# Patient Record
Sex: Female | Born: 1970
Health system: Southern US, Community
[De-identification: ages and names within clinical notes are randomized; demographics above are authoritative.]

## PROBLEM LIST (undated history)

## (undated) HISTORY — PX: TUMOR EXCISION: SHX421

---

## 1998-06-15 ENCOUNTER — Encounter: Admission: RE | Admit: 1998-06-15 | Discharge: 1998-06-15 | Payer: Self-pay | Admitting: Family Medicine

## 2000-07-12 ENCOUNTER — Encounter: Admission: RE | Admit: 2000-07-12 | Discharge: 2000-07-12 | Payer: Self-pay | Admitting: Family Medicine

## 2000-07-12 ENCOUNTER — Ambulatory Visit (HOSPITAL_COMMUNITY): Admission: RE | Admit: 2000-07-12 | Discharge: 2000-07-12 | Payer: Self-pay | Admitting: Family Medicine

## 2000-07-17 ENCOUNTER — Encounter: Admission: RE | Admit: 2000-07-17 | Discharge: 2000-07-17 | Payer: Self-pay | Admitting: Family Medicine

## 2001-04-04 ENCOUNTER — Encounter (INDEPENDENT_AMBULATORY_CARE_PROVIDER_SITE_OTHER): Payer: Self-pay | Admitting: *Deleted

## 2001-04-04 ENCOUNTER — Inpatient Hospital Stay (HOSPITAL_COMMUNITY): Admission: RE | Admit: 2001-04-04 | Discharge: 2001-04-05 | Payer: Self-pay | Admitting: Neurosurgery

## 2001-04-12 ENCOUNTER — Other Ambulatory Visit: Admission: RE | Admit: 2001-04-12 | Discharge: 2001-04-12 | Payer: Self-pay | Admitting: Neurosurgery

## 2001-04-13 ENCOUNTER — Encounter: Payer: Self-pay | Admitting: Neurosurgery

## 2001-04-13 ENCOUNTER — Encounter: Admission: RE | Admit: 2001-04-13 | Discharge: 2001-04-13 | Payer: Self-pay | Admitting: Neurosurgery

## 2001-04-15 ENCOUNTER — Emergency Department (HOSPITAL_COMMUNITY): Admission: EM | Admit: 2001-04-15 | Discharge: 2001-04-15 | Payer: Self-pay

## 2001-04-18 ENCOUNTER — Encounter: Payer: Self-pay | Admitting: Hematology and Oncology

## 2001-04-18 ENCOUNTER — Inpatient Hospital Stay (HOSPITAL_COMMUNITY): Admission: EM | Admit: 2001-04-18 | Discharge: 2001-04-27 | Payer: Self-pay | Admitting: Hematology and Oncology

## 2001-04-18 ENCOUNTER — Encounter (INDEPENDENT_AMBULATORY_CARE_PROVIDER_SITE_OTHER): Payer: Self-pay | Admitting: Specialist

## 2001-06-05 ENCOUNTER — Ambulatory Visit (HOSPITAL_COMMUNITY): Admission: RE | Admit: 2001-06-05 | Discharge: 2001-06-05 | Payer: Self-pay | Admitting: Hematology and Oncology

## 2001-06-05 ENCOUNTER — Encounter: Payer: Self-pay | Admitting: Hematology and Oncology

## 2001-07-16 ENCOUNTER — Ambulatory Visit (HOSPITAL_COMMUNITY): Admission: RE | Admit: 2001-07-16 | Discharge: 2001-07-16 | Payer: Self-pay | Admitting: Hematology and Oncology

## 2001-07-16 ENCOUNTER — Encounter: Payer: Self-pay | Admitting: Hematology and Oncology

## 2001-07-16 ENCOUNTER — Encounter (INDEPENDENT_AMBULATORY_CARE_PROVIDER_SITE_OTHER): Payer: Self-pay | Admitting: Specialist

## 2001-07-30 ENCOUNTER — Ambulatory Visit (HOSPITAL_COMMUNITY): Admission: RE | Admit: 2001-07-30 | Discharge: 2001-07-30 | Payer: Self-pay | Admitting: Hematology and Oncology

## 2001-07-30 ENCOUNTER — Encounter: Payer: Self-pay | Admitting: Hematology and Oncology

## 2001-07-31 ENCOUNTER — Encounter: Payer: Self-pay | Admitting: Hematology and Oncology

## 2001-07-31 ENCOUNTER — Ambulatory Visit (HOSPITAL_COMMUNITY): Admission: RE | Admit: 2001-07-31 | Discharge: 2001-07-31 | Payer: Self-pay | Admitting: Hematology and Oncology

## 2001-09-28 ENCOUNTER — Ambulatory Visit: Admission: RE | Admit: 2001-09-28 | Discharge: 2001-10-04 | Payer: Self-pay | Admitting: Radiation Oncology

## 2001-10-05 ENCOUNTER — Ambulatory Visit: Admission: RE | Admit: 2001-10-05 | Discharge: 2001-10-15 | Payer: Self-pay | Admitting: Radiation Oncology

## 2001-11-07 ENCOUNTER — Encounter: Payer: Self-pay | Admitting: *Deleted

## 2001-11-07 ENCOUNTER — Ambulatory Visit (HOSPITAL_COMMUNITY): Admission: RE | Admit: 2001-11-07 | Discharge: 2001-11-07 | Payer: Self-pay | Admitting: *Deleted

## 2002-03-19 ENCOUNTER — Encounter: Payer: Self-pay | Admitting: *Deleted

## 2002-03-19 ENCOUNTER — Ambulatory Visit (HOSPITAL_COMMUNITY): Admission: RE | Admit: 2002-03-19 | Discharge: 2002-03-19 | Payer: Self-pay | Admitting: *Deleted

## 2002-06-27 ENCOUNTER — Encounter: Payer: Self-pay | Admitting: *Deleted

## 2002-06-27 ENCOUNTER — Ambulatory Visit (HOSPITAL_COMMUNITY): Admission: RE | Admit: 2002-06-27 | Discharge: 2002-06-27 | Payer: Self-pay | Admitting: *Deleted

## 2003-02-06 ENCOUNTER — Ambulatory Visit (HOSPITAL_COMMUNITY): Admission: RE | Admit: 2003-02-06 | Discharge: 2003-02-06 | Payer: Self-pay | Admitting: Oncology

## 2003-08-14 ENCOUNTER — Ambulatory Visit (HOSPITAL_COMMUNITY): Admission: RE | Admit: 2003-08-14 | Discharge: 2003-08-14 | Payer: Self-pay | Admitting: Oncology

## 2003-11-11 ENCOUNTER — Ambulatory Visit: Payer: Self-pay | Admitting: Oncology

## 2004-01-01 ENCOUNTER — Ambulatory Visit (HOSPITAL_COMMUNITY): Admission: RE | Admit: 2004-01-01 | Discharge: 2004-01-01 | Payer: Self-pay | Admitting: Oncology

## 2004-03-04 IMAGING — CT CT ABDOMEN W/ CM
1 of 5 series · 12 of 32 positions shown, 18 images · IV contrast (omnipaque)
Comparison: none

FINDINGS
CLINICAL DATA: RESTAGING NON-HODGKIN'S LYMPHOMA.
CT SCAN OF THE CHEST, ABDOMEN, AND PELVIS WITH CONTRAST
COMPARISON IS MADE WITH THE PRIOR EXAMINATIONS DATED 03/19/02.
CT SCAN OF THE CHEST WITH CONTRAST:
MULTIPLE SPIRAL IMAGES WERE MADE THROUGH THE CHEST AFTER INTRAVENOUS INJECTION OF 150 CC OF
OMNIPAQUE 300.
THE THYROID GLAND IS NORMAL.  THERE IS NO SUPRACLAVICULAR ADENOPATHY.  THERE IS NO AXILLARY, HILAR,
OR MEDIASTINAL ADENOPATHY.  THE LUNG FIELDS ARE CLEAR WITH NO PLEURAL EFFUSION.  THERE IS NO BONE
ABNORMALITY.
IMPRESSION
NEGATIVE CT SCAN OF THE CHEST WITH CONTRAST.
CT SCAN OF THE ABDOMEN WITH CONTRAST:
ADDITIONAL IMAGES THROUGH THE ABDOMEN AFTER ORAL AND INTRAVENOUS CONTRAST DEMONSTRATE A NORMAL
APPEARING LIVER AND SPLEEN.  AGAIN NOTED IS FOCAL FATTY CHANGE OF THE MEDIAL SEGMENT OF THE LEFT
LOBE OF THE LIVER.  THERE IS NO ADENOPATHY.  THE PANCREAS AND RETROPERITONEAL STRUCTURES ARE NORMAL.
NEGATIVE CT SCAN OF THE ABDOMEN WITH CONTRAST WITH NO ADENOPATHY.
CT SCAN OF THE PELVIS WITH CONTRAST:
ADDITIONAL IMAGES THROUGH THE PELVIS AFTER ORAL AND INTRAVENOUS CONTRAST DEMONSTRATE NO MASS OR
ADENOPATHY.  THERE IS A VERY SMALL AMOUNT OF FREE FLUID IN THE PELVIS.  THERE ARE BILATERAL
FOLLICULAR CYSTS IN THE OVARIES.
NEGATIVE CT SCAN OF THE PELVIS WITH CONTRAST WITH THE EXCEPTION OF A SMALL AMOUNT OF FREE FLUID IN
THE PELVIS, NORMAL FOR THE PATIENT'S AGE.

[Series 4: a/p 5.0 b30f · axial · 0.63mm/px · z∈[-608,-203]mm · 12 of 97 slices shown, 18 images]
[im 8/97  soft-tissue]
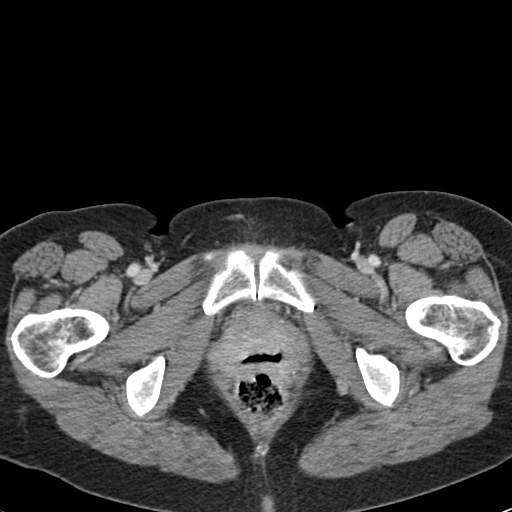
[im 8/97  bone]
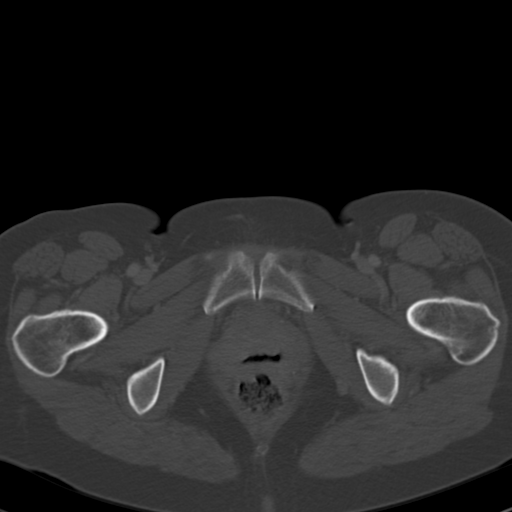
[im 15/97  soft-tissue]
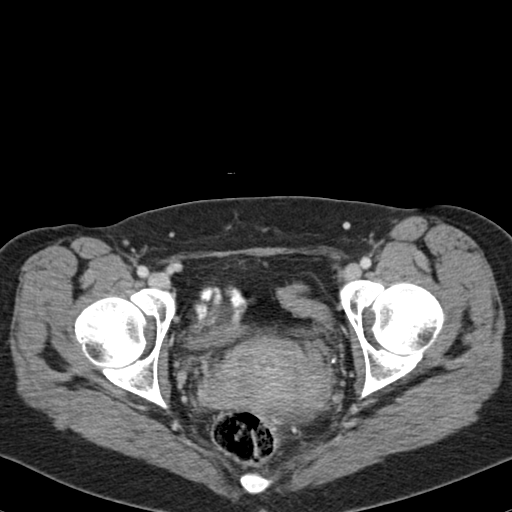
[im 23/97  soft-tissue]
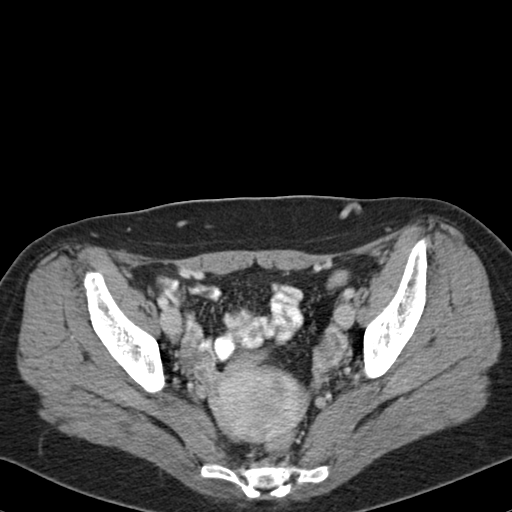
[im 30/97  soft-tissue]
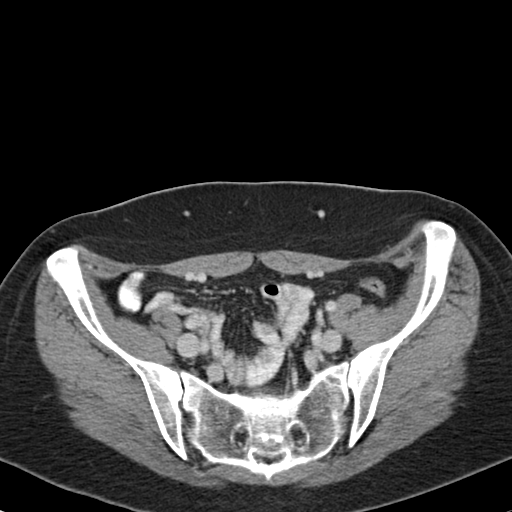
[im 37/97  soft-tissue]
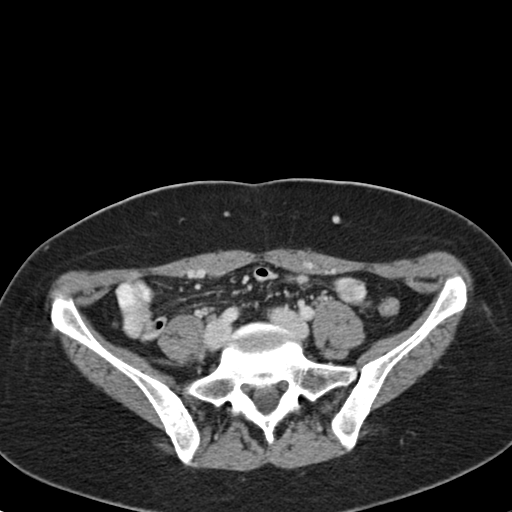
[im 45/97  soft-tissue]
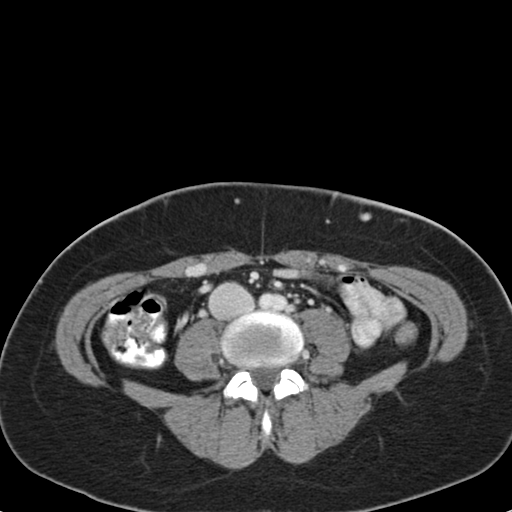
[im 52/97  soft-tissue]
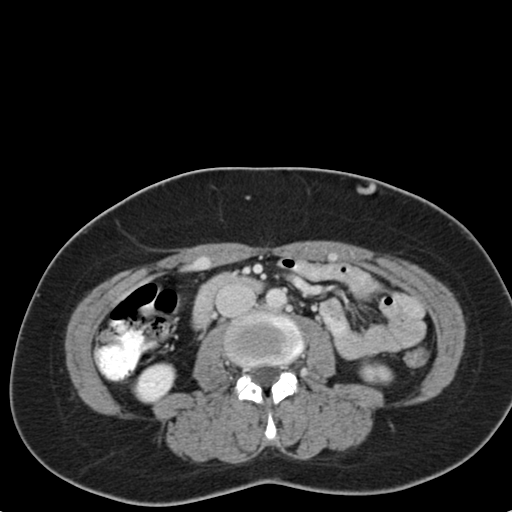
[im 60/97  soft-tissue]
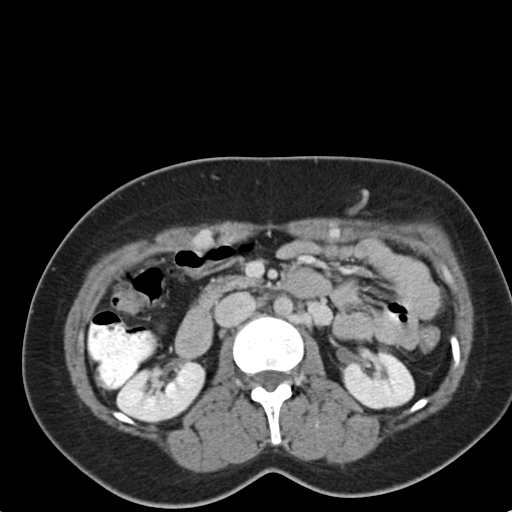
[im 67/97  soft-tissue]
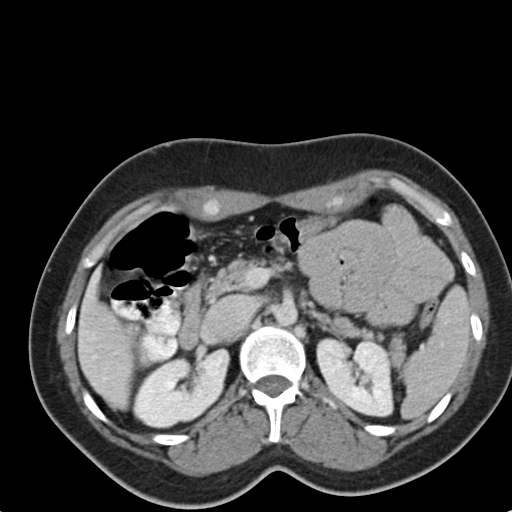
[im 67/97  lung]
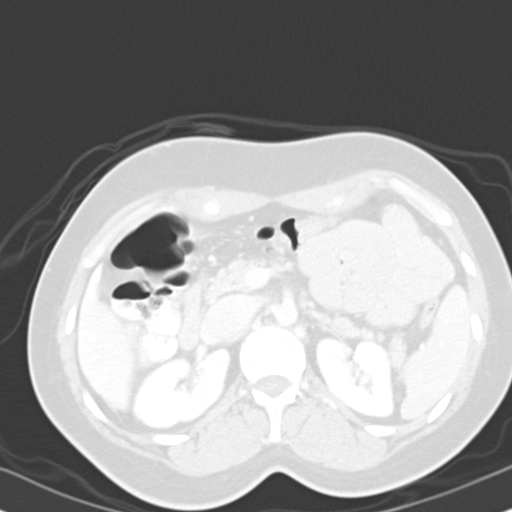
[im 67/97  bone]
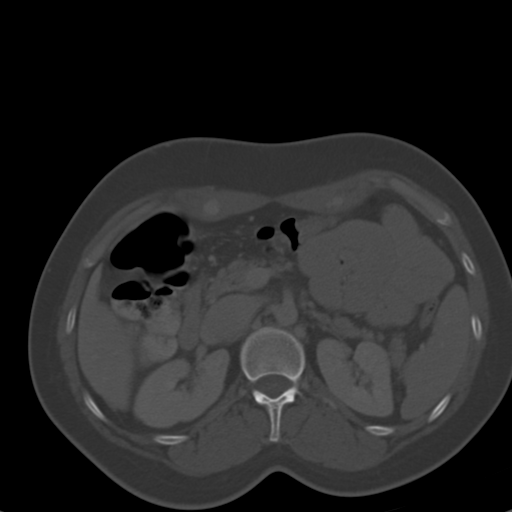
[im 74/97  soft-tissue]
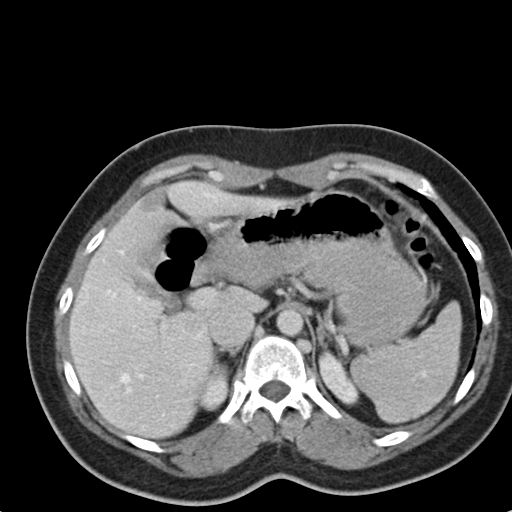
[im 74/97  lung]
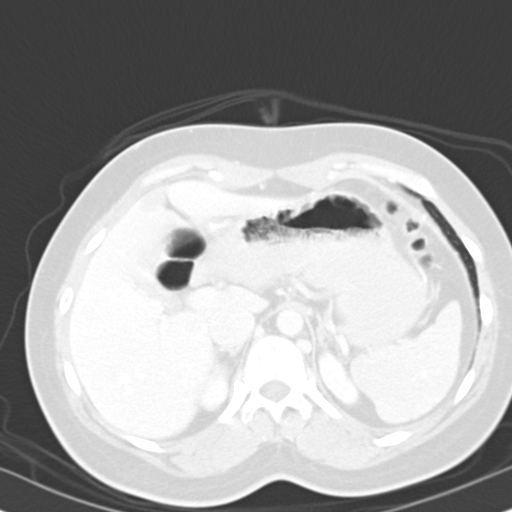
[im 82/97  soft-tissue]
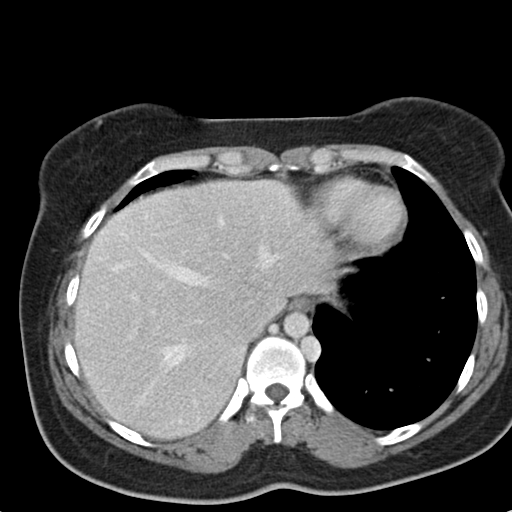
[im 82/97  lung]
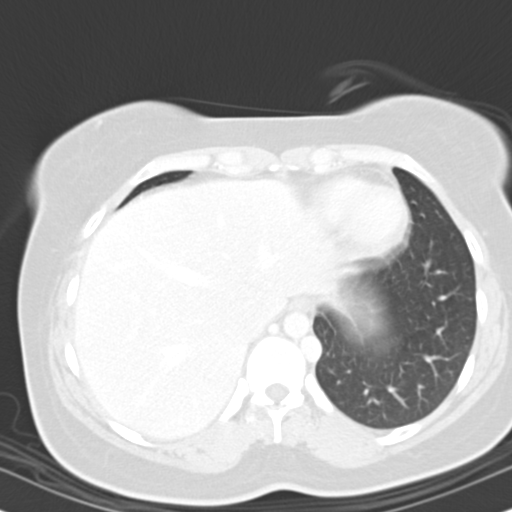
[im 89/97  soft-tissue]
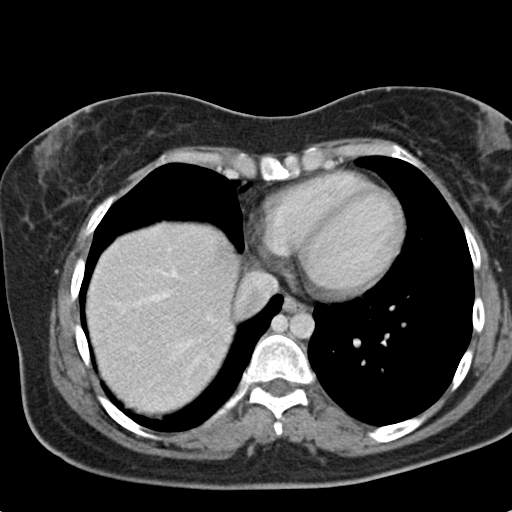
[im 89/97  lung]
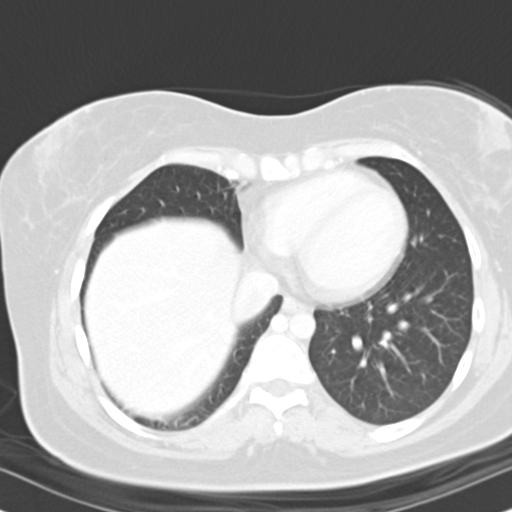

[12 of 32 positions shown; findings below may reference images not displayed]

## 2004-08-02 ENCOUNTER — Ambulatory Visit: Payer: Self-pay | Admitting: Oncology

## 2004-08-10 ENCOUNTER — Ambulatory Visit (HOSPITAL_COMMUNITY): Admission: RE | Admit: 2004-08-10 | Discharge: 2004-08-10 | Payer: Self-pay | Admitting: Oncology

## 2004-09-09 ENCOUNTER — Encounter (INDEPENDENT_AMBULATORY_CARE_PROVIDER_SITE_OTHER): Payer: Self-pay | Admitting: *Deleted

## 2004-09-09 ENCOUNTER — Ambulatory Visit: Admission: RE | Admit: 2004-09-09 | Discharge: 2004-09-09 | Payer: Self-pay | Admitting: Oncology

## 2004-12-13 ENCOUNTER — Ambulatory Visit: Payer: Self-pay | Admitting: Oncology

## 2005-06-20 ENCOUNTER — Ambulatory Visit: Payer: Self-pay | Admitting: Oncology

## 2005-08-09 ENCOUNTER — Ambulatory Visit: Payer: Self-pay | Admitting: Oncology

## 2005-08-10 LAB — CBC WITH DIFFERENTIAL/PLATELET
Basophils Absolute: 0 10*3/uL (ref 0.0–0.1)
Eosinophils Absolute: 0.1 10*3/uL (ref 0.0–0.5)
HCT: 42.9 % (ref 34.8–46.6)
HGB: 14.7 g/dL (ref 11.6–15.9)
LYMPH%: 18.1 % (ref 14.0–48.0)
MCH: 32.6 pg (ref 26.0–34.0)
MCV: 95 fL (ref 81.0–101.0)
MONO%: 8.6 % (ref 0.0–13.0)
NEUT#: 5.3 10*3/uL (ref 1.5–6.5)
NEUT%: 71 % (ref 39.6–76.8)
Platelets: 245 10*3/uL (ref 145–400)

## 2005-08-10 LAB — COMPREHENSIVE METABOLIC PANEL
Albumin: 4.2 g/dL (ref 3.5–5.2)
Alkaline Phosphatase: 52 U/L (ref 39–117)
BUN: 10 mg/dL (ref 6–23)
Creatinine, Ser: 0.76 mg/dL (ref 0.40–1.20)
Glucose, Bld: 103 mg/dL — ABNORMAL HIGH (ref 70–99)
Potassium: 4.9 mEq/L (ref 3.5–5.3)

## 2005-08-23 ENCOUNTER — Ambulatory Visit (HOSPITAL_COMMUNITY): Admission: RE | Admit: 2005-08-23 | Discharge: 2005-08-23 | Payer: Self-pay | Admitting: Oncology

## 2006-08-09 ENCOUNTER — Ambulatory Visit: Payer: Self-pay | Admitting: Oncology

## 2006-08-11 LAB — CBC WITH DIFFERENTIAL/PLATELET
Basophils Absolute: 0 10*3/uL (ref 0.0–0.1)
Eosinophils Absolute: 0.2 10*3/uL (ref 0.0–0.5)
HCT: 41.9 % (ref 34.8–46.6)
HGB: 14.7 g/dL (ref 11.6–15.9)
MCV: 93.7 fL (ref 81.0–101.0)
MONO%: 9.1 % (ref 0.0–13.0)
NEUT#: 5.1 10*3/uL (ref 1.5–6.5)
RDW: 13.2 % (ref 11.3–14.5)
lymph#: 1.2 10*3/uL (ref 0.9–3.3)

## 2006-08-11 LAB — COMPREHENSIVE METABOLIC PANEL
Albumin: 4.2 g/dL (ref 3.5–5.2)
BUN: 8 mg/dL (ref 6–23)
Calcium: 8.9 mg/dL (ref 8.4–10.5)
Chloride: 108 mEq/L (ref 96–112)
Glucose, Bld: 108 mg/dL — ABNORMAL HIGH (ref 70–99)
Potassium: 4.2 mEq/L (ref 3.5–5.3)

## 2007-08-13 ENCOUNTER — Ambulatory Visit: Payer: Self-pay | Admitting: Oncology

## 2009-08-02 ENCOUNTER — Encounter: Admission: RE | Admit: 2009-08-02 | Discharge: 2009-08-02 | Payer: Self-pay | Admitting: Neurosurgery

## 2009-10-06 ENCOUNTER — Encounter: Admission: RE | Admit: 2009-10-06 | Discharge: 2009-10-06 | Payer: Self-pay | Admitting: Neurosurgery

## 2010-01-30 ENCOUNTER — Encounter: Payer: Self-pay | Admitting: Oncology

## 2010-05-19 ENCOUNTER — Other Ambulatory Visit: Payer: Self-pay | Admitting: Obstetrics and Gynecology

## 2010-05-28 NOTE — Discharge Summary (Signed)
[facility]  Patient:    MYLEI, BRACKEEN Visit Number: [id number] MRN: [id number]          Service Type: MED Location: 2S [phone] Attending Physician:  Janan Halter Dictated by:   Lowell C. Catha Gosselin, M.D. Admit Date:  04/18/2001 Discharge Date: 04/27/2001                             Discharge Summary  DISCHARGE DIAGNOSES: 1. Large cell non-Hodgkins lymphoma with need for chemotherapy. 2. Pain control improved secondary to treatment of #1. 3. Cardiac involvement with non-Hodgkins lymphoma with need for    anticoagulation, now accomplished.  SUMMARY:  The patient is a 40 year old female who had developed some facial swelling and subsequently was found to have a radiculopathy.  She went on to the operating room by Dr. Julio Sicks and had right supraclavicular brachioplexus exploration with resection of nerve sheath tumor.  This was sent to [facility] in [place] and was thought to represent a large B-cell type non-Hodgkins lymphoma, ______ CD20 positive.  CT scan of the head had been negative.  CT scan of the neck otherwise was unremarkable.  CT scan of the chest showed some SVC obstruction and question of some right atrial abnormality.  The abdomen was unremarkable.  We subsequently admitted her at this time for further workup.  She did not have any external adenopathy that really could be felt.  HOSPITAL COURSE:  The patient subsequently underwent a MUGA exam, which showed a good ejection fraction, adequate for proceeding with Adriamycin chemotherapy.  This was 57%.  She had an echocardiogram performed on April 18, 2001 which showed left ventricular ejection fraction 55%-65%.  There was a suggestion of a small right atrial mass in the lateral wall.  She subsequently underwent a transesophageal echocardiogram, which did show a mass present, which looked to be an extension of adenopathy through the right atrial wall.  Although no  specific clot was seen, it was felt that she was at high risk for thrombosis and embolism and subsequently was started on first heparin and then coumadinization.  Bone marrow biopsy and aspiration were performed while she was in the hospital.  That returned showing no evidence of any bone marrow involvement and her cytogenetics returned normal.  Pathology is [id number].  Subsequently, we proceeded with CHOP chemotherapy consisting of Cytoxan, Adriamycin, vincristine, and prednisone.  That was well tolerated. The therapy was given on April 21, 2001.  She had no nausea with this.  Her pain control gradually got better after treatment.  Her strength got better as well and she was eating well.  She was given Coumadin teaching.  The family does wish a second opinion over at [facility], which we will arrange as an outpatient.  DISCHARGE MEDICATIONS: 1. OxyContin 40 mg and 20 mg tablets, one of each every 12 hours for pain. 2. Coumadin 5 mg p.o. at 6 p.m. 3. Dilaudid 8 mg p.o. q.4h. p.r.n. for pain. 4. Restoril 30 mg p.o. q.h.s. 5. Phenergan 25 mg p.o. q.6h. p.r.n. for nausea.  ACTIVITY:  No restrictions.  DIET:  No restrictions.  DISCHARGE INSTRUCTIONS:  Call [phone] for problems or questions.  FOLLOWUP:  Return to see me the following week.  CONDITION ON DISCHARGE:  At the time of discharge, her overall status was improved.  PROGNOSIS:  Guarded.  I should mention that her last PT value prior to discharge was 17.8 with  an INR of 1.7. Dictated by:   Lowell C. Catha Gosselin, M.D. Attending Physician:  Janan Halter DD:  05/03/01 TD:  05/04/01 Job: [id number]

## 2010-05-28 NOTE — Op Note (Signed)
[facility]  Patient:    Maureen Richardson, Maureen Richardson Visit Number: [id number] MRN: [id number]          Service Type: SUR Location: [phone] Attending Physician:  Donn Pierini Dictated by:   Julio Sicks, M.D. Proc. 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  OPERATION PERFORMED:  Right supraclavicular brachial plexus exploration with resection of nerve sheath tumor utilizing microdissection.  SURGEON:  Julio Sicks, M.D.  ASSISTANT:  Donalee Citrin, Montez Hageman.  ANESTHESIA:  General endotracheal.  INDICATIONS FOR PROCEDURE:  Ms. Boullion is a 40 year old female with a history of severe right upper extremity pain, paresthesias and weakness consistent with a C8 radiculopathy with some features of a C7 radiculopathy as well. work-up was performed and has demonstrated an enhancing lesion consistent with a neurofibroma at the C3-4 level extending into the right-sided C4 foramen. Also discovered, an extraforaminal nerve sheath tumor involving the C8 nerve extending to the lower trunk of the brachial plexus and with an inferior aspect to this tracking down and causing compression of the stellate ganglion. The patient also had evidence of a Horners syndrome.  I had a long talk with the patient with regard to possible options for treatment.  I explained to her that I did not think that this tumor would be completely resectable no matter what approach was used.  I think that a good portion of her symptoms are due to thoracic outlet syndrome secondary to compression of this tumor against the nerve root by the anterior scalene.  I recommended exploration of her brachial plexus with attempts at resection of this tumor and decompression of her nerve roots by  sectioning the anterior scalene.  The patient is aware of the risks and benefits of the and wishes to proceed.  DESCRIPTION OF PROCEDURE:  The patient was taken to the operating room and placed on the operating table in supine position.  After an adequate level of anesthesia was achieved, the patient was positioned supine with her head turned slightly toward the left.  The patients anterior cervical region was shaved and prepped sterilely.  A linear incision was made along the midpoint of the sternocleidomastoid muscle and carried down over the clavicle.  This was carried down sharply to the platysma.  The platysma was then divided vertically overlying the sternocleidomastoid muscle.  The sternocleidomastoid muscle was dissected free and the clavicular head of the sternocleidomastoid muscle was divided.  The omohyoid muscle was identified and swept superiorly. Adventitial planes were divided.  The fat pad in the posterior triangle was mobilized and retracted laterally.  The anterior scalene muscle was identified.  The phrenic nerve was identified and protected.  The upper, middle and lower trunks of the brachial plexus were identified.  Dissection was then performed proximally along the lower trunk.  C8 nerve root was identified.  The anterior scalene muscle was then divided from a lateral to medial direction freeing the proximal aspect of the lower trunk.  The tumor was encountered along the lateral aspect of the vertebral body and compression the inferior aspects of the C8 nerve.  This tumor was resected.  The C8 nerve was then skeletonized using microdissection.  The C8 nerve was grossly abnormal with a fascicular neurofibroma.  This was dissected free using microdissection and resected.  There is some question as the neurofibroma appeared to track more proximally in the foramen.  It was not feasible to resect any more of this, however.  At this point, all visible tumor had  been resected.  Frozen section was consistent with an atypical neurofibroma but no definite signs of malignancy.  The wound was then irrigated with antibiotic solution.  Surgicel was laid down in the operative field for hemostasis which was found to be good.  The wound was then closed in layers with Vicryl sutures and PDS was used in inverted subcuticular fashion to the skin.  Steri-Strips and sterile dressing were applied.  There were no apparent complications.  The patient tolerated the procedure well and returned to the recovery room postoperatively. Dictated by:   Julio Sicks, M.D. Attending Physician:  Donn Pierini DD:  04/04/01 TD:  04/05/01 Job: [id number]

## 2010-05-28 NOTE — H&P (Signed)
[facility]  Patient:    Maureen Richardson, Maureen Richardson Visit Number: [id number] MRN: [id number]          Service Type: MED Location: 2S [phone] Attending Physician:  Janan Halter Dictated by:   Lowell C. Catha Gosselin, M.D. Admit Date:  04/18/2001   CC:         Julio Sicks, M.D.   History and Physical  DATE OF BIRTH:  12/30/1970  HISTORY OF PRESENT ILLNESS:  [name] is admitted today after I met her in the clinic for the first time.  To review, she is a recently turned 40 year old female who had developed some facial swelling slowly over a years time and severe right upper extremity pain, paresthesias, and weakness consistent with a C8 radiculopathy with some C7 radiculopathy as well.  She ended up having an MRI scan done of the C spine that showed neurofibroma.  It appeared at C3-4, extending into the right-sided C4 foramen and then an extraforaminal nerve sheath tumor involving the C8 nerve.  This extended down to the lower trunk of the brachial plexus and causing compression of the stellate ganglion with evidence of an Horners syndrome.  The patient subsequently went to the operating room on April 04, 2001, by Julio Sicks, M.D., and had a right supraclavicular brachial plexus exploration with resection of the nerve sheath tumor.  Pathology was complex and was sent to Dr. Primitivo Gauze at [facility].  He felt this represented a large B cell type non-Hodgkins lymphoma, highly CD20 positive.  She has had a CT scan done of the head that was negative, a CT scan done of the neck which was unremarkable, and a CT scan done of the chest which showed some SVC obstruction possibly with some adenopathy and the question of possibly even some right atrial abnormality with echocardiogram suggested. There was a cystic-looking structure in the liver, but no burden of abdominal adenopathy.  She is having a fair amount of pain in general.   She is also having some night sweats over the last few weeks.  I am going to admit her at this time because her symptomatology is so severe to go ahead and get an echocardiogram done, a MUGA exam done, a bone marrow exam done, and then start therapy, potentially Rituxan and CHOP chemotherapy.  PAST MEDICAL HISTORY:  Otherwise significant for bilateral tubal ligation. She has had three children.  She does not wish to have any further children.  ALLERGIES:  None.  MEDICATIONS:  She is currently just on p.r.n. pain medication.  FAMILY HISTORY:  Strongly positive for breast and lung cancer.  SOCIAL HISTORY:  She is a smoker.  She lives in [place], [place].  REVIEW OF SYSTEMS:  Otherwise as above.  PHYSICAL EXAMINATION:  She is 5 feet 6 inches tall and weighs 161 pounds.  HEENT:  Oral mucosa within normal limits.  NECK:  No cervical, supraclavicular, or axillary adenopathy.  HEART:  S1 greater than S2 without rubs or gallops.  LUNGS:  Clear to auscultation.  ABDOMEN:  Benign without hepatosplenomegaly.  Bowel sounds active.  No inguinal adenopathy.  No extremity edema noted.  LABORATORY DATA:  Pending.  ASSESSMENT:  Large cell non-Hodgkins lymphoma involving C8.  She probably also has involvement of C3-4.  This looks to be outside the central nervous system.  PLAN:  Since this is outside the central nervous system, we will plan on finishing up staging with a bone marrow  study, get a MUGA exam for pre-Adriamycin, ejection fraction, and also do an echocardiogram to exam the right atrium.  We will potentially start Rituxan and CHOP chemotherapy and possibly given her very unusual presentation have her seen at the medical centers between cycles #1 and #2.  She certainly would be a bone marrow transplant candidate if she does not get a fairly quite remission with standard therapy. Dictated by:   Lowell C. Catha Gosselin, M.D. Attending Physician:  Janan Halter DD:   04/18/01 TD:  04/18/01 Job: [id number]

## 2010-05-28 NOTE — Cardiovascular Report (Signed)
[place]  Patient:    Maureen Richardson, HOLSTER Visit Number: [id number] MRN: [id number]          Service Type: MED Location: 2S [phone] Attending Physician:  Janan Halter Dictated by:   Noralyn Pick Eden Emms, M.D. [facility] Proc. Date: 04/23/01 Admit Date:  04/18/2001 Discharge Date: 04/23/2001   CC:         Lowell C. Catha Gosselin, M.D.   Cardiac Catheterization  PROCEDURES: Transesophageal echocardiography.  INDICATIONS: The patient has lymphoma/leukemia. There is a question of heart involvement. The patient has SVC syndrome. Study was done to assess right atrial mass.  SEDATION: The patient was sedated with 6 mg of Versed.  DESCRIPTION OF PROCEDURE: Using digital technique an Omniplane probe was advanced into the distal esophagus without incident.  Transgastric imaging revealed normal LV function with an EF of 60%. Aortic, mitral, tricuspid, and pulmonary valves were normal. There was a large probable tumor mass in the right atrium on long axis imaging. This clearly aminated from the SVC. There was no flow through the SVC and the mass aminated into the entire superior and posterior dome of the right atrium. There was no atrial septal defect and not extension into the tricuspid regurgitation valve. The mass was somewhat heterogenesis in nature with areas of hypolucency. I suspect that this mass is somewhat thrombogenic and the patient should be anticoagulated.  FINAL IMPRESSION: 1. Large mass in the lateral and posterior wall of the right atrium aminating    from the superior vena cava. 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 2. Normal tricuspid, pulmonary, aortic, and mitral valves. 3. Normal right ventricle and left ventricular function. 4. No pericardial effusion. 5. Normal aorta.  The patient has already begun chemotherapy per hematology oncology. Dictated by:   Noralyn Pick Eden Emms, M.D. [facility] Attending  Physician:  Janan Halter DD:  04/23/01 TD:  04/23/01 Job: [id number]

## 2012-03-14 ENCOUNTER — Other Ambulatory Visit: Payer: Self-pay

## 2012-03-14 DIAGNOSIS — Z1231 Encounter for screening mammogram for malignant neoplasm of breast: Secondary | ICD-10-CM

## 2012-04-18 ENCOUNTER — Ambulatory Visit
Admission: RE | Admit: 2012-04-18 | Discharge: 2012-04-18 | Disposition: A | Discharge status: Home / Self Care | Payer: BC Managed Care – PPO | Source: Ambulatory Visit

## 2012-04-18 DIAGNOSIS — Z1231 Encounter for screening mammogram for malignant neoplasm of breast: Secondary | ICD-10-CM

## 2016-04-29 ENCOUNTER — Other Ambulatory Visit: Payer: Self-pay | Admitting: Specialist

## 2016-04-29 DIAGNOSIS — M5127 Other intervertebral disc displacement, lumbosacral region: Secondary | ICD-10-CM

## 2016-04-29 DIAGNOSIS — M50123 Cervical disc disorder at C6-C7 level with radiculopathy: Secondary | ICD-10-CM

## 2016-05-16 ENCOUNTER — Ambulatory Visit
Admission: RE | Admit: 2016-05-16 | Discharge: 2016-05-16 | Disposition: A | Discharge status: Home / Self Care | Payer: BLUE CROSS/BLUE SHIELD | Source: Ambulatory Visit | Attending: Specialist | Admitting: Specialist

## 2016-05-16 DIAGNOSIS — M50123 Cervical disc disorder at C6-C7 level with radiculopathy: Secondary | ICD-10-CM

## 2016-05-16 DIAGNOSIS — M5127 Other intervertebral disc displacement, lumbosacral region: Secondary | ICD-10-CM

## 2016-05-23 ENCOUNTER — Encounter: Payer: Self-pay | Admitting: Hematology

## 2016-05-30 ENCOUNTER — Ambulatory Visit (HOSPITAL_BASED_OUTPATIENT_CLINIC_OR_DEPARTMENT_OTHER): Payer: BLUE CROSS/BLUE SHIELD

## 2016-05-30 ENCOUNTER — Telehealth: Payer: Self-pay | Admitting: Hematology

## 2016-05-30 ENCOUNTER — Ambulatory Visit (HOSPITAL_BASED_OUTPATIENT_CLINIC_OR_DEPARTMENT_OTHER): Payer: BLUE CROSS/BLUE SHIELD | Admitting: Hematology

## 2016-05-30 VITALS — BP 108/50 | HR 84 | Temp 98.8°F | Resp 20 | Ht 66.5 in | Wt 177.8 lb

## 2016-05-30 DIAGNOSIS — E041 Nontoxic single thyroid nodule: Secondary | ICD-10-CM | POA: Diagnosis not present

## 2016-05-30 DIAGNOSIS — R61 Generalized hyperhidrosis: Secondary | ICD-10-CM

## 2016-05-30 DIAGNOSIS — C8331 Diffuse large B-cell lymphoma, lymph nodes of head, face, and neck: Secondary | ICD-10-CM

## 2016-05-30 LAB — CBC & DIFF AND RETIC
BASO%: 0.2 % (ref 0.0–2.0)
BASOS ABS: 0 10*3/uL (ref 0.0–0.1)
EOS%: 1.8 % (ref 0.0–7.0)
Eosinophils Absolute: 0.2 10*3/uL (ref 0.0–0.5)
HCT: 41.5 % (ref 34.8–46.6)
HGB: 14.1 g/dL (ref 11.6–15.9)
Immature Retic Fract: 2.7 % (ref 1.60–10.00)
LYMPH%: 21.5 % (ref 14.0–49.7)
MCH: 32 pg (ref 25.1–34.0)
MCHC: 34 g/dL (ref 31.5–36.0)
MCV: 94.3 fL (ref 79.5–101.0)
MONO#: 0.7 10*3/uL (ref 0.1–0.9)
MONO%: 8 % (ref 0.0–14.0)
NEUT#: 5.6 10*3/uL (ref 1.5–6.5)
NEUT%: 68.5 % (ref 38.4–76.8)
PLATELETS: ADEQUATE 10*3/uL (ref 145–400)
RBC: 4.4 10*6/uL (ref 3.70–5.45)
RDW: 12.7 % (ref 11.2–14.5)
RETIC CT ABS: 62.92 10*3/uL (ref 33.70–90.70)
Retic %: 1.43 % (ref 0.70–2.10)
WBC: 8.2 10*3/uL (ref 3.9–10.3)
lymph#: 1.8 10*3/uL (ref 0.9–3.3)
nRBC: 0 % (ref 0–0)

## 2016-05-30 LAB — COMPREHENSIVE METABOLIC PANEL
ALK PHOS: 49 U/L (ref 40–150)
ALT: 19 U/L (ref 0–55)
ANION GAP: 9 meq/L (ref 3–11)
AST: 19 U/L (ref 5–34)
Albumin: 3.9 g/dL (ref 3.5–5.0)
BUN: 9.5 mg/dL (ref 7.0–26.0)
CO2: 25 mEq/L (ref 22–29)
Calcium: 8.9 mg/dL (ref 8.4–10.4)
Chloride: 106 mEq/L (ref 98–109)
Creatinine: 0.8 mg/dL (ref 0.6–1.1)
EGFR: >90 mL/min/{1.73_m2} (ref >90)
GLUCOSE: 99 mg/dL (ref 70–140)
Potassium: 3.7 mEq/L (ref 3.5–5.1)
Sodium: 140 mEq/L (ref 136–145)
Total Bilirubin: 0.58 mg/dL (ref 0.20–1.20)
Total Protein: 6.4 g/dL (ref 6.4–8.3)

## 2016-05-30 NOTE — Telephone Encounter (Signed)
Gave patient avs report and sent back to lab. Central radiology will call re scan. Per 5/21 los f/u as needed.

## 2016-05-30 NOTE — Progress Notes (Signed)
Marland Kitchen    HEMATOLOGY/ONCOLOGY CONSULTATION NOTE  Date of Service: 05/30/2016  Patient Care Team: Dewayne Shorter, PA-C as PCP - General (Physician Assistant) Orthopedics - Dr Georgana Curio MD  CHIEF COMPLAINTS/PURPOSE OF CONSULTATION:  H/o stage IIA non-Hodgkin's lymphoma (DLBCL)  HISTORY OF PRESENTING ILLNESS:   Maureen Richardson is a wonderful 46 y.o. female who has been referred to [place] by Dr .Dewayne Shorter, PA-C  for evaluation and management of k/h/o Diffuse large B cell lymphoma.   As per available records the patient has a history of stage II EA non-Hodgkin's lymphoma/diffuse large B-cell lymphoma diagnosed in 2003 when she presented with right upper extremity weakness and numbness and paresthesias. She had her husband report that the diagnosis was somewhat delayed given the atypical presentation. Patient will eventually diagnosed with diffuse large B-cell lymphoma and was treated with 6 cycles of R CHOP which she completed in October 2004. She did not require any radiation therapy. She notes that she has continued to have right upper extremity weakness and some tingling that did not completely resolve due to her right brachial plexopathy.  She has also had cervical spine degenerative disc disease and has apparently required surgery with spinal fusion at C6-7.  Patient notes she recently had a fall and developed left lower extremity discomfort and weakness and was referred to orthopedics who noted that she has left L5 radical of the related to L5 foraminal stenosis from her fall and disc disease.  She also notes some increased neck discomfort and right upper extremity tingling and numbness and some cramping/muscle twitching. She was evaluated for these symptoms by her orthopedic Dr Tonita Cong with an MRI of the cervical spine done on 05/16/2016 which showed disc bulge and left paracentral protrusion at C5-6 resulting in deformity of the cord worse on the left. Spondylosis has progressed  since the last MRI. No notable change in the central disc protrusion at C4-5 which contacts and mildly deforms the ventral cord. Status post C6-7 discectomy and fusion.  2.6 cm T2 hyperintense lesion in the right thyroid nodule which is chronic but has increased in size since her last MRI in 2011.  Patient reports that she is having some night sweats and wonders if this is related to possible lymphoma or her perimenopausal symptoms. Has been gaining weight with no acute weight loss. Notes some fatigue. No overt fevers or chills. No overt palpable masses or skin changes. Husband notes that her face feels somewhat puffier and that she has snoring some.  No other acute new focal symptoms.  MEDICAL HISTORY:   #1 history of stage IIA non-Hodgkin's lymphoma diagnosed in 2003. Non-Hodgkin lymphoma involving the brachial plexus on the right side and caused right upper extremity neurological deficits. Treated with 6 cycles of R CHOP and completed treatment in October 2004. No RT. Previously was followed by Dr.Khan/Dr Alen Blew.  #2 active smoker  #3 history of cervical degenerative disc disease status post cervical spine surgery with fusion at C 6-7  #4 she reports having had a heart lesion that was operated on a long time ago.  #5 left L5 reticulocyte Lopp as they secondary to disc herniation and foraminal stenosis at L5  SURGICAL HISTORY:  1) LN biopsy 2) C 6-7 spinal fusion 3) ?surgery for tumor on rt side of heart  SOCIAL HISTORY: Social History   Social History  . Marital status: Married    Spouse name: N/A  . Number of children: N/A  . Years of education: N/A  Occupational History  . Not on file.   Social History Main Topics  . Smoking status: Not on file  . Smokeless tobacco: Not on file  . Alcohol use Not on file  . Drug use: Unknown  . Sexual activity: Not on file   Other Topics Concern  . Not on file   Social History Narrative  . No narrative on file  Active  smoker one pack per day for more than 30 years Occasional alcohol use Works with the [employer].   FAMILY HISTORY: No reported family history of lymphoma or other blood disorders or cancers.  ALLERGIES:  has No Known Allergies.  MEDICATIONS:   Not on any chronic medications  REVIEW OF SYSTEMS:    10 Point review of Systems was done is negative except as noted above.  PHYSICAL EXAMINATION: ECOG PERFORMANCE STATUS: 1 - Symptomatic but completely ambulatory  . Vitals:   05/30/16 1522  BP: (!) 108/50  Pulse: 84  Resp: 20  Temp: 98.8 F (37.1 C)   Filed Weights   05/30/16 1522  Weight: 177 lb 12.8 oz (80.6 kg)   .Body mass index is 28.27 kg/m.  GENERAL:alert, in no acute distress and comfortable SKIN: no acute rashes, no significant lesions EYES: conjunctiva are pink and non-injected, sclera anicteric OROPHARYNX: MMM, no exudates, no oropharyngeal erythema or ulceration NECK: supple, no JVD LYMPH:  no palpable lymphadenopathy in the cervical, axillary or inguinal regions LUNGS: clear to auscultation b/l with normal respiratory effort HEART: regular rate & rhythm ABDOMEN:  normoactive bowel sounds , non tender, not distended. Extremity: no pedal edema PSYCH: alert & oriented x 3 with fluent speech NEURO: Some decreased in grip strength in her right upper extremity.  LABORATORY DATA:  I have reviewed the data as listed  . CBC Latest Ref Rng & Units 05/30/2016 08/11/2006 08/10/2005  WBC 3.9 - 10.3 10e3/uL 8.2 7.1 7.5  Hemoglobin 11.6 - 15.9 g/dL 14.1 14.7 14.7  Hematocrit 34.8 - 46.6 % 41.5 41.9 42.9  Platelets 145 - 400 10e3/uL Clumped Platelets--Appears Adequate 238 245    . CMP Latest Ref Rng & Units 05/30/2016 08/11/2006 08/10/2005  Glucose 70 - 140 mg/dl 99 108(H) 103(H)  BUN 7.0 - 26.0 mg/dL 9.5 8 10   Creatinine 0.6 - 1.1 mg/dL 0.8 0.74 0.76  Sodium 136 - 145 mEq/L 140 140 141  Potassium 3.5 - 5.1 mEq/L 3.7 4.2 4.9  Chloride 96 - 112 mEq/L - 108  108  CO2 22 - 29 mEq/L 25 20 26   Calcium 8.4 - 10.4 mg/dL 8.9 8.9 9.4  Total Protein 6.4 - 8.3 g/dL 6.4 6.4 6.8  Total Bilirubin 0.20 - 1.20 mg/dL 0.58 0.8 1.0  Alkaline Phos 40 - 150 U/L 49 45 52  AST 5 - 34 U/L 19 18 16   ALT 0 - 55 U/L 19 19 20    . Lab Results  Component Value Date   LDH 147 05/30/2016   Component     Latest Ref Rng & Units 05/30/2016  T3 Uptake Ratio     24 - 39 % 28  Free Thyroxine Index     1.2 - 4.9 2.0  Thyroxine (T4)     4.5 - 12.0 ug/dL 7.3  T4,Free(Direct)     0.82 - 1.77 ng/dL 1.31  TSH     0.308 - 3.960 m(IU)/L 1.195     RADIOGRAPHIC STUDIES: I have personally reviewed the radiological images as listed and agreed with the findings in the report. Mr  Cervical Spine Wo Contrast  Result Date: 05/16/2016 CLINICAL DATA:  Neck pain for 3 months with numbness in the right arm. History of fall in November, 2017. Status post cervical fusion in 2008. History of non-Hodgkin's lymphoma. EXAM: MRI CERVICAL SPINE WITHOUT CONTRAST TECHNIQUE: Multiplanar, multisequence MR imaging of the cervical spine was performed. No intravenous contrast was administered. COMPARISON:  MRI cervical spine 08/02/2009. Plain film cervical spine 10/06/2009. FINDINGS: Alignment: There is mild kyphosis centered about the C5-6 level. No listhesis. Vertebrae: No fracture or worrisome lesion. Solid C6-7 fusion is new since the prior MRI. Cord: Normal signal throughout. Posterior Fossa, vertebral arteries, paraspinal tissues: T2 hyperintense lesion in the right lobe of the thyroid measures 2.6 cm transverse by 1.9 cm AP by a 2.5 cm craniocaudal. It is partially visualized on the prior MRI where it measured 1.3 cm transverse by 1.6 cm craniocaudal by approximately 1 cm AP. Disc levels: C2-3: Moderate bilateral facet degenerative disease. No disc bulge or protrusion. The central canal and foramina are open. Facet arthropathy is worsened since the prior exam. C3-4: Shallow disc bulge without central  canal or foraminal stenosis. C4-5: Left paracentral protrusion contacts and slightly deforms the ventral cord. The foramina are open. The appearance is unchanged. C5-6: There has been some progression of disease at this level. The patient has a shallow disc bulge and superimposed left paracentral disc protrusion. Deformity of the cord is worse on the left. The foramina are open. C6-7: Status post discectomy and fusion since the prior MRI. The central canal and foramina are widely patent. C7-T1:  Negative. IMPRESSION: Disc bulge and left paracentral protrusion at C5-6 results in deformity of the cord, worse on the left. Spondylosis has progressed since the prior MRI. No notable change in a central disc protrusion at C4-5 which contacts and mildly deforms the ventral cord. Status post C6-7 discectomy and fusion since the prior MRI. The central canal and foramina are open. 2.6 cm T2 hyperintense lesion in the right lobe of the thyroid is chronic but has increased in size since the prior MRI. Consider further evaluation with thyroid ultrasound. If patient is clinically hyperthyroid, consider nuclear medicine thyroid uptake and scan. Electronically Signed   By: Inge Rise M.D.   On: 05/16/2016 11:01   Mr Lumbar Spine Wo Contrast  Result Date: 05/16/2016 CLINICAL DATA:  Lumbago sciatica. Low back pain. History of lymphoma EXAM: MRI LUMBAR SPINE WITHOUT CONTRAST TECHNIQUE: Multiplanar, multisequence MR imaging of the lumbar spine was performed. No intravenous contrast was administered. COMPARISON:  None. FINDINGS: Segmentation:  Normal Alignment:  Normal Vertebrae:  Normal Conus medullaris: Extends to the L2 level and appears normal. Paraspinal and other soft tissues: No retroperitoneal mass or adenopathy. Paraspinous muscles normal. Disc levels: L1-2:  Negative L2-3:  Negative L3-4:  Negative L4-5: Mild disc degeneration with mild disc bulging. Mild facet degeneration. No significant spinal or foraminal stenosis  L5-S1: Disc degeneration with disc space narrowing and mild disc bulging. Negative for stenosis or neural impingement. IMPRESSION: Mild degenerative changes at L4-5 and L5-S1. Negative for neural impingement. Electronically Signed   By: Franchot Gallo M.D.   On: 05/16/2016 10:53    ASSESSMENT & PLAN:   46 year old female with   #1 Previous history of stage IIa diffuse large B-cell lymphoma affecting the right brachial plexus causing a right brachial plexopathy diagnosed in 2003 and treated with 6 cycles of R CHOP which were completed in October 2004. She had persistent symptoms related to injury that had not resolved.  Also has cervical spine degenerative disc disease that are adding to her symptoms. CBC is within normal limits. Platelets are clumped but appear adequate. LDH level is within normal limits. No other overt palpable lymphadenopathy peripherally to suggest diffuse large B-cell lymphoma recurrence.  #2 night sweats with subjective fatigue.  Plan -Labs are send and evaluated. -Patient is very anxious about her diffuse large B-cell lymphoma having come back given her right brachial plexopathy symptoms and night sweats since this is how she presented the first time. We discussed that recurrence of diffuse large B-cell lymphoma at this time point more than 10 years out is less likely and that her symptoms are more likely to be related to her cervical spinal degenerative disc disease and her previous right brachial plexopathy. -Given her constitutional symptoms we'll get a PET CT scan to rule out any concerns for lymphoma recurrence. -She needs to continue follow-up with orthopedics to determine if there is any indicated C-spine interventions required.  #3 enlarging 2.6 cm thyroid nodule. Thyroid function tests appear within normal limits. Plan -Would recommend primary care physician give the patient an endocrinology for follow-up for further evaluation and management of her thyroid  nodule. -PET/CT scan will probably determine if this is FDG avid.  Labs today PET/CT in 1 week RTC with Dr Irene Limbo on an as needed basis   All of the patients questions were answered with apparent satisfaction. The patient knows to call the clinic with any problems, questions or concerns.  I spent 40 minutes counseling the patient face to face. The total time spent in the appointment was 60 minutes and more than 50% was on counseling and direct patient cares.    Sullivan Lone MD [place] AAHIVMS [facility] Hematology/Oncology Physician [facility]  (Office):       [phone] (Work cell):  [phone] (Fax):           [phone]  05/30/2016 3:29 PM

## 2016-05-30 NOTE — Patient Instructions (Signed)
Thank you for choosing [facility] to provide your oncology and hematology care.  To afford each patient quality time with our providers, please arrive 30 minutes before your scheduled appointment time.  If you arrive late for your appointment, you may be asked to reschedule.  We strive to give you quality time with our providers, and arriving late affects you and other patients whose appointments are after yours.   If you are a no show for multiple scheduled visits, you may be dismissed from the clinic at the providers discretion.    Again, thank you for choosing [facility], our hope is that these requests will decrease the amount of time that you wait before being seen by our physicians.  ______________________________________________________________________  Should you have questions after your visit to the [facility], please contact our office at [phone] between the hours of 8:30 and 4:30 p.m.    Voicemails left after 4:30p.m will not be returned until the following business day.    For prescription refill requests, please have your pharmacy contact us directly.  Please also try to allow 48 hours for prescription requests.    Please contact the scheduling department for questions regarding scheduling.  For scheduling of procedures such as PET scans, CT scans, MRI, Ultrasound, etc please contact central scheduling at [phone].    Resources For Cancer Patients and Caregivers:   Oncolink.org:  A wonderful resource for patients and healthcare providers for information regarding your disease, ways to tract your treatment, what to expect, etc.     American Cancer Society:  800-227-2345  Can help patients locate various types of support and financial assistance  Cancer Care: 1-800-813-HOPE (4673) Provides financial assistance, online support groups, medication/co-pay assistance.    [place] DSS:  336-641-3447 Where to apply for food  stamps, Medicaid, and utility assistance  Medicare Rights Center: 800-333-4114 Helps people with Medicare understand their rights and benefits, navigate the Medicare system, and secure the quality healthcare they deserve  SCAT: 336-333-6589 [place] [employer]'s shared-ride transportation service for eligible riders who have a disability that prevents them from riding the fixed route bus.    For additional information on assistance programs please contact our social worker:   Grier Hock/Abigail Elmore:  [phone]            

## 2016-05-31 LAB — T4, FREE: FREE T4: 1.31 ng/dL (ref 0.82–1.77)

## 2016-05-31 LAB — TSH: TSH: 1.195 m[IU]/L (ref 0.308–3.960)

## 2016-05-31 LAB — T3 UPTAKE
Free Thyroxine Index: 2 (ref 1.2–4.9)
T3 Uptake Ratio: 28 % (ref 24–39)

## 2016-05-31 LAB — T4: T4 TOTAL: 7.3 ug/dL (ref 4.5–12.0)

## 2016-05-31 LAB — LACTATE DEHYDROGENASE: LDH: 147 U/L (ref 125–245)

## 2016-06-02 ENCOUNTER — Other Ambulatory Visit: Payer: Self-pay | Admitting: *Deleted

## 2016-06-02 DIAGNOSIS — C8331 Diffuse large B-cell lymphoma, lymph nodes of head, face, and neck: Secondary | ICD-10-CM

## 2016-06-27 ENCOUNTER — Other Ambulatory Visit: Payer: Self-pay | Admitting: Physician Assistant

## 2016-06-27 DIAGNOSIS — E0789 Other specified disorders of thyroid: Secondary | ICD-10-CM

## 2016-06-27 DIAGNOSIS — E079 Disorder of thyroid, unspecified: Secondary | ICD-10-CM

## 2016-07-04 ENCOUNTER — Ambulatory Visit
Admission: RE | Admit: 2016-07-04 | Discharge: 2016-07-04 | Disposition: A | Discharge status: Home / Self Care | Payer: BLUE CROSS/BLUE SHIELD | Source: Ambulatory Visit | Attending: Physician Assistant | Admitting: Physician Assistant

## 2016-07-04 DIAGNOSIS — E0789 Other specified disorders of thyroid: Secondary | ICD-10-CM

## 2016-07-04 DIAGNOSIS — E079 Disorder of thyroid, unspecified: Secondary | ICD-10-CM

## 2016-07-05 ENCOUNTER — Encounter (HOSPITAL_COMMUNITY): Payer: BLUE CROSS/BLUE SHIELD

## 2019-02-11 DIAGNOSIS — L0390 Cellulitis, unspecified: Secondary | ICD-10-CM

## 2019-02-11 HISTORY — DX: Cellulitis, unspecified: L03.90

## 2019-03-01 ENCOUNTER — Other Ambulatory Visit: Payer: Self-pay

## 2019-03-01 ENCOUNTER — Emergency Department (HOSPITAL_COMMUNITY): Payer: BC Managed Care – PPO

## 2019-03-01 ENCOUNTER — Encounter (HOSPITAL_COMMUNITY): Payer: Self-pay | Admitting: Internal Medicine

## 2019-03-01 ENCOUNTER — Inpatient Hospital Stay (HOSPITAL_COMMUNITY)
Admission: EM | Admit: 2019-03-01 | Discharge: 2019-03-06 | DRG: 872 | Disposition: A | Discharge status: Home / Self Care | Payer: BC Managed Care – PPO | Attending: Internal Medicine | Admitting: Internal Medicine

## 2019-03-01 DIAGNOSIS — R509 Fever, unspecified: Secondary | ICD-10-CM | POA: Diagnosis not present

## 2019-03-01 DIAGNOSIS — Z803 Family history of malignant neoplasm of breast: Secondary | ICD-10-CM

## 2019-03-01 DIAGNOSIS — L0390 Cellulitis, unspecified: Secondary | ICD-10-CM | POA: Diagnosis present

## 2019-03-01 DIAGNOSIS — N951 Menopausal and female climacteric states: Secondary | ICD-10-CM | POA: Diagnosis present

## 2019-03-01 DIAGNOSIS — L03211 Cellulitis of face: Secondary | ICD-10-CM | POA: Diagnosis present

## 2019-03-01 DIAGNOSIS — L409 Psoriasis, unspecified: Secondary | ICD-10-CM

## 2019-03-01 DIAGNOSIS — E041 Nontoxic single thyroid nodule: Secondary | ICD-10-CM | POA: Diagnosis not present

## 2019-03-01 DIAGNOSIS — Z6829 Body mass index (BMI) 29.0-29.9, adult: Secondary | ICD-10-CM

## 2019-03-01 DIAGNOSIS — R21 Rash and other nonspecific skin eruption: Secondary | ICD-10-CM | POA: Diagnosis not present

## 2019-03-01 DIAGNOSIS — A419 Sepsis, unspecified organism: Principal | ICD-10-CM | POA: Diagnosis present

## 2019-03-01 DIAGNOSIS — Z9221 Personal history of antineoplastic chemotherapy: Secondary | ICD-10-CM

## 2019-03-01 DIAGNOSIS — Z8572 Personal history of non-Hodgkin lymphomas: Secondary | ICD-10-CM

## 2019-03-01 DIAGNOSIS — Z20822 Contact with and (suspected) exposure to covid-19: Secondary | ICD-10-CM | POA: Diagnosis present

## 2019-03-01 DIAGNOSIS — F17210 Nicotine dependence, cigarettes, uncomplicated: Secondary | ICD-10-CM | POA: Diagnosis present

## 2019-03-01 DIAGNOSIS — R590 Localized enlarged lymph nodes: Secondary | ICD-10-CM

## 2019-03-01 DIAGNOSIS — A46 Erysipelas: Secondary | ICD-10-CM | POA: Diagnosis present

## 2019-03-01 DIAGNOSIS — Z8249 Family history of ischemic heart disease and other diseases of the circulatory system: Secondary | ICD-10-CM

## 2019-03-01 DIAGNOSIS — E669 Obesity, unspecified: Secondary | ICD-10-CM | POA: Diagnosis present

## 2019-03-01 LAB — URINALYSIS, ROUTINE W REFLEX MICROSCOPIC
Bilirubin Urine: NEGATIVE
Glucose, UA: NEGATIVE mg/dL
Ketones, ur: 80 mg/dL — AB
Leukocytes,Ua: NEGATIVE
Nitrite: NEGATIVE
Protein, ur: 30 mg/dL — AB
Specific Gravity, Urine: 1.031 — ABNORMAL HIGH (ref 1.005–1.030)
pH: 5 (ref 5.0–8.0)

## 2019-03-01 LAB — COMPREHENSIVE METABOLIC PANEL
ALT: 37 U/L (ref 0–44)
AST: 31 U/L (ref 15–41)
Albumin: 3.5 g/dL (ref 3.5–5.0)
Alkaline Phosphatase: 80 U/L (ref 38–126)
Anion gap: 13 (ref 5–15)
BUN: 11 mg/dL (ref 6–20)
CO2: 19 mmol/L — ABNORMAL LOW (ref 22–32)
Calcium: 9.1 mg/dL (ref 8.9–10.3)
Chloride: 101 mmol/L (ref 98–111)
Creatinine, Ser: 0.91 mg/dL (ref 0.44–1.00)
GFR calc Af Amer: >60 mL/min (ref >60)
GFR calc non Af Amer: >60 mL/min (ref >60)
Glucose, Bld: 131 mg/dL — ABNORMAL HIGH (ref 70–99)
Potassium: 4.4 mmol/L (ref 3.5–5.1)
Sodium: 133 mmol/L — ABNORMAL LOW (ref 135–145)
Total Bilirubin: 1 mg/dL (ref 0.3–1.2)
Total Protein: 7.5 g/dL (ref 6.5–8.1)

## 2019-03-01 LAB — CBC WITH DIFFERENTIAL/PLATELET
Abs Immature Granulocytes: 0.1 10*3/uL — ABNORMAL HIGH (ref 0.00–0.07)
Basophils Absolute: 0 10*3/uL (ref 0.0–0.1)
Basophils Relative: 0 %
Eosinophils Absolute: 0.1 10*3/uL (ref 0.0–0.5)
Eosinophils Relative: 0 %
HCT: 49.4 % — ABNORMAL HIGH (ref 36.0–46.0)
Hemoglobin: 16.3 g/dL — ABNORMAL HIGH (ref 12.0–15.0)
Immature Granulocytes: 1 %
Lymphocytes Relative: 4 %
Lymphs Abs: 0.6 10*3/uL — ABNORMAL LOW (ref 0.7–4.0)
MCH: 31.5 pg (ref 26.0–34.0)
MCHC: 33 g/dL (ref 30.0–36.0)
MCV: 95.4 fL (ref 80.0–100.0)
Monocytes Absolute: 0.6 10*3/uL (ref 0.1–1.0)
Monocytes Relative: 3 %
Neutro Abs: 15.8 10*3/uL — ABNORMAL HIGH (ref 1.7–7.7)
Neutrophils Relative %: 92 %
Platelets: 230 10*3/uL (ref 150–400)
RBC: 5.18 MIL/uL — ABNORMAL HIGH (ref 3.87–5.11)
RDW: 12.3 % (ref 11.5–15.5)
WBC: 17.2 10*3/uL — ABNORMAL HIGH (ref 4.0–10.5)
nRBC: 0 % (ref 0.0–0.2)

## 2019-03-01 LAB — POC SARS CORONAVIRUS 2 AG -  ED: SARS Coronavirus 2 Ag: NEGATIVE

## 2019-03-01 LAB — GROUP A STREP BY PCR: Group A Strep by PCR: NOT DETECTED

## 2019-03-01 LAB — HIV ANTIBODY (ROUTINE TESTING W REFLEX): HIV Screen 4th Generation wRfx: NONREACTIVE

## 2019-03-01 LAB — LACTIC ACID, PLASMA
Lactic Acid, Venous: 2.6 mmol/L (ref 0.5–1.9)
Lactic Acid, Venous: 1.4 mmol/L (ref 0.5–1.9)
Lactic Acid, Venous: 1.5 mmol/L (ref 0.5–1.9)

## 2019-03-01 MED ORDER — ACETAMINOPHEN 650 MG RE SUPP: 650.0000 mg | Freq: Four times a day (QID) | RECTAL | Status: DC | PRN

## 2019-03-01 MED ORDER — ACETAMINOPHEN 325 MG PO TABS
650.0000 mg | ORAL_TABLET | Freq: Four times a day (QID) | ORAL | Status: DC | PRN
  Administered 2019-03-01 – 2019-03-06 (×14): 650 mg via ORAL
  Filled 2019-03-01 (×14): qty 2

## 2019-03-01 MED ORDER — SODIUM CHLORIDE 0.9 % IV BOLUS
1000.0000 mL | Freq: Once | INTRAVENOUS | Status: AC
  Administered 2019-03-01: 13:00:00 1000 mL via INTRAVENOUS

## 2019-03-01 MED ORDER — VANCOMYCIN HCL 1500 MG/300ML IV SOLN
1500.0000 mg | Freq: Once | INTRAVENOUS | Status: AC
  Administered 2019-03-01: 13:00:00 1500 mg via INTRAVENOUS
  Filled 2019-03-01: qty 300

## 2019-03-01 MED ORDER — ACETAMINOPHEN 325 MG PO TABS
650.0000 mg | ORAL_TABLET | Freq: Once | ORAL | Status: AC
  Administered 2019-03-01: 12:00:00 650 mg via ORAL
  Filled 2019-03-01: qty 2

## 2019-03-01 MED ORDER — SODIUM CHLORIDE 0.9 % IV SOLN: INTRAVENOUS | Status: AC

## 2019-03-01 MED ORDER — KETOROLAC TROMETHAMINE 15 MG/ML IJ SOLN
15.0000 mg | Freq: Once | INTRAMUSCULAR | Status: AC
  Administered 2019-03-01: 21:00:00 15 mg via INTRAVENOUS
  Filled 2019-03-01: qty 1

## 2019-03-01 MED ORDER — ENOXAPARIN SODIUM 40 MG/0.4ML ~~LOC~~ SOLN
40.0000 mg | SUBCUTANEOUS | Status: DC
  Administered 2019-03-01 – 2019-03-04 (×4): 40 mg via SUBCUTANEOUS
  Filled 2019-03-01 (×6): qty 0.4

## 2019-03-01 MED ORDER — SODIUM CHLORIDE 0.9 % IV BOLUS
1000.0000 mL | Freq: Once | INTRAVENOUS | Status: AC
  Administered 2019-03-01: 12:00:00 1000 mL via INTRAVENOUS

## 2019-03-01 MED ORDER — VANCOMYCIN HCL IN DEXTROSE 1-5 GM/200ML-% IV SOLN
1000.0000 mg | Freq: Two times a day (BID) | INTRAVENOUS | Status: DC
  Administered 2019-03-02: 02:00:00 1000 mg via INTRAVENOUS
  Filled 2019-03-01 (×2): qty 200

## 2019-03-01 NOTE — H&P (Signed)
Date: 03/01/2019               Patient Name:  Maureen Richardson MRN: [id number]  DOB: 1970-09-01 Age / Sex: 49 y.o., female   PCP: Dewayne Shorter, PA-C         Medical Service: Internal Medicine Teaching Service         Attending Physician: Dr. Rebeca Alert, Raynaldo Opitz, MD    First Contact: Marva Panda, MD, [place] Pager: [place] [phone])  Second Contact: Sherry Ruffing, MD, [place] Pager: [place]do [place] [phone])       After Hours (After 5p/  First Contact Pager: [phone]  weekends / holidays): Second Contact Pager: [phone]   Chief Complaint: fevers, rash and swollen lymph nodes  History of Present Illness: Maureen Richardson is a  49 y.o. female w/ PMH significant for non-Hodgkin B cell lymphoma s/p chemotherapy in 2004 presenting with fever, rash and lymphadenopathy. Patient notes that she started having fever of 100.4 and generalized body ache on Wednesday. She got COVID and flu test which were negative. However, she continued to experience low grade fevers and generalized body aches and also noted decreased appetite in setting of altered sense of taste. She visited her PCP's office on 2/17 and was told to take Tylenol and Ibuprofen for her symptoms which she has been doing with some improvement in her fever but has persistent chills and body aches. She also notes that she has been able to feel her posterior cervical lymph nodes and they have been tender for this duration. She does note that she had one episode of watery stools yesterday.This morning, she woke up and noted that her forehead felt very tender to touch and noticed bumps on her forehead. She notes that she got her eyebrows waxed on Tuesday but otherwise no changes in detergent, sheets, shampoos, pillows, clothing. She denies any new medications or changes to her diet. She notes that she has a cat that some times likes to poke at her nose and forehead but is unsure if she has had any recent scratches from that. She denies any prior history of similar  symptoms. She did notice some chest pain earlier today that was non-radiating, substernal, sharp, lasting for around 20-30 minutes that resolved without any intervention. She notes that it was unrelated to activity or with deep breathing. Patient does note that she often has hot flashes and night sweats but reports that may be related to her being perimenopausal. She notes her last period was few weeks ago, lasting for five days. No abnormal vaginal bleeding or discharge noted. She denies any current chest pain, shortness of breath, difficulty breathing, swallowing or alteration in sense of smell. She does not some increased urinary frequency but denies any dysuria.  Of note, patient was last seen by oncologist in 2018. She was having constitutional symptoms with right upper extremity weakness, numbness and paresthesias with night sweats. She was recommended for PET CT to rule out concerns for lymphoma recurrence but patient did not follow up with this. She was also noted to have an enlarging 2.6cm thyroid nodule, but thyroid function tests wnl. She was recommended for follow up biopsy vs PET/CT. However, patient has not followed up on this.   Patient noted to have cellulitic rash, fever and tachycardia in the ED for which sepsis protocol initiated. Repeat COVID test negative. Labs significant for WBC 17.2 and lactic acidosis of 2.6. Blood cultures collected and patient started on IV vancomycin. She is admitted for further evaluation and  management.   Meds:  Current Meds  Medication Sig  . Chlorphen-Pseudoephed-APAP (THERAFLU FLU/COLD PO) Take 2 tablets by mouth every 6 (six) hours as needed (cold symptoms).  Marland Kitchen ibuprofen (ADVIL) 200 MG tablet Take 400 mg by mouth every 8 (eight) hours as needed for moderate pain.  . Pseudoeph-CPM-DM-APAP (THERAFLU NIGHTTIME MAX ST PO) Take 2 tablets by mouth every 6 (six) hours as needed (cold symptoms).     Allergies: Allergies as of 03/01/2019  . (No Known  Allergies)   History reviewed. No pertinent past medical history.  Family History: No family history on file. Aunt had breast cancer. Maternal grandfather has heart disease. No known hx of diabetes. No thyroid issues.   Social History:  Social History   Tobacco Use  . Smoking status: Not on file  Substance Use Topics  . Alcohol use: Not on file  . Drug use: Not on file    Lives with her husband. Works in an [employer]. Smokes about 1 ppd, for about 30 years. EtOH use moderate, about 1-2 times per month, 1-2 mix drinks.   Review of Systems: A complete ROS was negative except as per HPI.   Physical Exam: Blood pressure 123/62, pulse 99, temperature (!) 102 F (38.9 C), temperature source Oral, resp. rate (!) 22, height 5\' 6"  (1.676 m), weight 77.1 kg, SpO2 98 %. Physical Exam Constitutional:      General: She is not in acute distress.    Appearance: She is not ill-appearing or diaphoretic.  HENT:     Head: Normocephalic and atraumatic.  Eyes:     Extraocular Movements: Extraocular movements intact.  Neck:     Thyroid: No thyromegaly.     Comments: Bilateral posterior cervical, posterior auricular, submandibular lymphadenopathy Cardiovascular:     Rate and Rhythm: Normal rate and regular rhythm.     Heart sounds: Normal heart sounds. No murmur. No friction rub. No gallop.   Pulmonary:     Effort: Pulmonary effort is normal.     Breath sounds: Normal breath sounds. No decreased breath sounds, wheezing, rhonchi or rales.  Chest:     Chest wall: No mass or tenderness.  Abdominal:     General: Bowel sounds are normal.     Palpations: Abdomen is soft. There is no mass.     Tenderness: There is no abdominal tenderness. There is no rebound.  Musculoskeletal:        General: Normal range of motion.     Cervical back: Normal range of motion and neck supple.     Right lower leg: No tenderness. No edema.     Left lower leg: No tenderness. No edema.  Lymphadenopathy:       Cervical: Cervical adenopathy present.  Skin:    General: Skin is warm and dry.     Capillary Refill: Capillary refill takes less than 2 seconds.     Findings: Rash present.  Neurological:     General: No focal deficit present.     Mental Status: She is alert and oriented to person, place, and time.     Cranial Nerves: No cranial nerve deficit.     Motor: No weakness.     CBC    Component Value Date/Time   WBC 17.2 (H) 03/01/2019 1119   RBC 5.18 (H) 03/01/2019 1119   HGB 16.3 (H) 03/01/2019 1119   HGB 14.1 05/30/2016 1617   HCT 49.4 (H) 03/01/2019 1119   HCT 41.5 05/30/2016 1617   PLT 230  03/01/2019 1119   PLT Clumped Platelets--Appears Adequate 05/30/2016 1617   MCV 95.4 03/01/2019 1119   MCV 94.3 05/30/2016 1617   MCH 31.5 03/01/2019 1119   MCHC 33.0 03/01/2019 1119   RDW 12.3 03/01/2019 1119   RDW 12.7 05/30/2016 1617   LYMPHSABS 0.6 (L) 03/01/2019 1119   LYMPHSABS 1.8 05/30/2016 1617   MONOABS 0.6 03/01/2019 1119   MONOABS 0.7 05/30/2016 1617   EOSABS 0.1 03/01/2019 1119   EOSABS 0.2 05/30/2016 1617   BASOSABS 0.0 03/01/2019 1119   BASOSABS 0.0 05/30/2016 1617   BMP Latest Ref Rng & Units 03/01/2019 05/30/2016 08/11/2006  Glucose 70 - 99 mg/dL 131(H) 99 108(H)  BUN 6 - 20 mg/dL 11 9.5 8  Creatinine 0.44 - 1.00 mg/dL 0.91 0.8 0.74  Sodium 135 - 145 mmol/L 133(L) 140 140  Potassium 3.5 - 5.1 mmol/L 4.4 3.7 4.2  Chloride 98 - 111 mmol/L 101 - 108  CO2 22 - 32 mmol/L 19(L) 25 20  Calcium 8.9 - 10.3 mg/dL 9.1 8.9 8.9   Urinalysis    Component Value Date/Time   COLORURINE AMBER (A) 03/01/2019 1208   APPEARANCEUR HAZY (A) 03/01/2019 1208   LABSPEC 1.031 (H) 03/01/2019 1208   PHURINE 5.0 03/01/2019 1208   GLUCOSEU NEGATIVE 03/01/2019 1208   HGBUR MODERATE (A) 03/01/2019 1208   BILIRUBINUR NEGATIVE 03/01/2019 1208   KETONESUR 80 (A) 03/01/2019 1208   PROTEINUR 30 (A) 03/01/2019 1208   NITRITE NEGATIVE 03/01/2019 1208   LEUKOCYTESUR NEGATIVE 03/01/2019 1208    EKG: pending  CXR: personally reviewed my interpretation is no opacities noted in bilateral lung fields, no cardiomegaly noted.   Assessment & Plan by Problem: Maureen Richardson is a 49yr old female with history of non-Hodgkin B-cell lymphoma s/p chemotherapy in 2003 presenting with low-grade fevers, generalized body aches and lymphadenopathy since Wednesday. Noted to have cellulitic rash with leukocytosis, tachycardia and fever concerning for sepsis.   Sepsis 2/2 cellulitis:  Patient with low grade fevers, generalized body aches and cervical lymphadenopathy since Wednesday with decreased appetite and dysguesia. Noticed tenderness of forehead and subsequently redness and rash at the area. On exam, she has cellulitic rash on forehead with leukocytosis and initial lactic acidosis. No  obvious scratches or puncture points noted on face or scalp.  - IV vancomycin - F/u blood cultures - F/u lactic acid  - F/u Urinalysis  - IV fluid resuscitation - Acetaminophen 650mg  q6h prn  Thyroid nodule:  Patient noted to have 2.6cm hyperintense lesion in right lobe of thyroid on MRI in 2018. On thyroid US, 2.8cm cystic nodule in right lobe of thyroid and findings suggestive of multinodular goiter. Percutaneous sampling recommended. However, patient has not followed up on this.On examination, no thyromegaly appreciated.   History of B-cell lymphoma: Patient with history of Non-Hodgkin B-cell lymphoma in 2003 s/p chemotherapy with R-CHOP completed in 2004. She was last seen by oncologist in 2018 with night sweats and subjective fatigue. She also notes lymphadenopathy since Wednesday. On examination, she does have tender posterior cervical, posterior auricular,and submandibular adenopathy. She does note ongoing symptoms of night sweats, decreased appetite (unclear of weight loss). Patient has not followed up with oncologist since 2018.     FEN/GI: Diet: Regular Fluids: NS 150 cc/hr Electrolytes: Monitor  and replete prn  VTE Prophylaxis: Lovenox 40mg  daily  Code: FULL  Dispo: Admit patient to Observation with expected length of stay less than 2 midnights.  Signed: Harvie Heck, MD  Internal Medicine, PGY-1 Pager: [phone] 03/01/2019,  5:39 PM

## 2019-03-01 NOTE — ED Notes (Signed)
This RN attempted to give report to RN for 6N Rm12, the secretary informed this RN the floor nurse was not ready due to not having read about the pt & that they are to be given 30 minutes of time to read the pts chart. No report was given.

## 2019-03-01 NOTE — ED Triage Notes (Signed)
Pt states that she developed a rash on her head that was sore to touch and red/swollen. States that she has some CP earlier this am as well but that has subsided.

## 2019-03-01 NOTE — ED Provider Notes (Signed)
[facility] EMERGENCY DEPARTMENT Provider Note   CSN: [id number] Arrival date & time: 03/01/19  1045     History Chief Complaint  Patient presents with  . Chest Pain  . Rash    Maureen Richardson is a 49 y.o. female.  Patient with history of non-Hodgkin's B-cell lymphoma status post surgery in 2003 --presents the emergency department with complaint of fever and fatigue over the past 48 hours with development of erythema and redness of the forehead starting today.  Also complains of swollen lymph nodes in her neck patient has a "headache" across the forehead in the area of the rash.  She has had a fever as high as 103 F.  Negative rapid Covid 2 days ago.  Monospot ordered yesterday.  Patient denies any new medications.  States that she had her eyebrows waxed about a week ago prior to symptom onset.  She denies sore throat, trouble swallowing or breathing.  No cough or shortness of breath.  She had some chest pain earlier today it was brief.  No nausea, vomiting, diarrhea.  Took tylenol last at around 8am.         No past medical history on file.  There are no problems to display for this patient.   The histories are not reviewed yet. Please review them in the "History" navigator section and refresh this [place].   OB History   No obstetric history on file.     No family history on file.  Social History   Tobacco Use  . Smoking status: Not on file  Substance Use Topics  . Alcohol use: Not on file  . Drug use: Not on file    Home Medications Prior to Admission medications   Not on File    Allergies    Patient has no known allergies.  Review of Systems   Review of Systems  Constitutional: Positive for fatigue. Negative for fever.  HENT: Negative for rhinorrhea and sore throat.   Eyes: Negative for redness.  Respiratory: Negative for cough.   Cardiovascular: Negative for chest pain.  Gastrointestinal: Negative for abdominal pain, diarrhea,  nausea and vomiting.  Genitourinary: Negative for dysuria.  Musculoskeletal: Positive for myalgias and neck pain. Negative for neck stiffness.  Skin: Positive for rash.  Neurological: Positive for weakness (Generalized). Negative for headaches.  Hematological: Positive for adenopathy.    Physical Exam Updated Vital Signs BP 127/74   Pulse (!) 128   Temp (!) 100.6 F (38.1 C) (Oral)   Resp (!) 27   SpO2 96%   Physical Exam Vitals and nursing note reviewed.  Constitutional:      Appearance: She is well-developed.  HENT:     Head: Normocephalic and atraumatic.     Right Ear: Tympanic membrane, ear canal and external ear normal.     Left Ear: Tympanic membrane and external ear normal.     Nose: Nose normal.     Mouth/Throat:     Mouth: Mucous membranes are moist.     Pharynx: No oropharyngeal exudate or posterior oropharyngeal erythema.  Eyes:     General:        Right eye: No discharge.        Left eye: No discharge.     Conjunctiva/sclera: Conjunctivae normal.  Cardiovascular:     Rate and Rhythm: Regular rhythm. Tachycardia present.     Heart sounds: Normal heart sounds.  Pulmonary:     Effort: Pulmonary effort is normal.  Breath sounds: Normal breath sounds.  Abdominal:     Palpations: Abdomen is soft.     Tenderness: There is no abdominal tenderness.  Musculoskeletal:     Cervical back: Normal range of motion and neck supple.     Right lower leg: No edema.     Left lower leg: No edema.  Skin:    General: Skin is warm and dry.     Findings: Rash present.     Comments: Patient with large irregular area of erythema, mild swelling and tenderness over the forehead extending onto the scalp and the posterior scalp.  Neurological:     Mental Status: She is alert.         ED Results / Procedures / Treatments   Labs (all labs ordered are listed, but only abnormal results are displayed) Labs Reviewed  CBC WITH DIFFERENTIAL/PLATELET - Abnormal; Notable for the  following components:      Result Value   WBC 17.2 (*)    RBC 5.18 (*)    Hemoglobin 16.3 (*)    HCT 49.4 (*)    Neutro Abs 15.8 (*)    Lymphs Abs 0.6 (*)    Abs Immature Granulocytes 0.10 (*)    All other components within normal limits  COMPREHENSIVE METABOLIC PANEL - Abnormal; Notable for the following components:   Sodium 133 (*)    CO2 19 (*)    Glucose, Bld 131 (*)    All other components within normal limits  LACTIC ACID, PLASMA - Abnormal; Notable for the following components:   Lactic Acid, Venous 2.6 (*)    All other components within normal limits  URINALYSIS, ROUTINE W REFLEX MICROSCOPIC - Abnormal; Notable for the following components:   Color, Urine AMBER (*)    APPearance HAZY (*)    Specific Gravity, Urine 1.031 (*)    Hgb urine dipstick MODERATE (*)    Ketones, ur 80 (*)    Protein, ur 30 (*)    Bacteria, UA FEW (*)    All other components within normal limits  GROUP A STREP BY PCR  CULTURE, BLOOD (ROUTINE X 2)  CULTURE, BLOOD (ROUTINE X 2)  POC SARS CORONAVIRUS 2 AG -  ED    ED ECG REPORT   Date: 03/01/2019  Rate: 128  Rhythm: sinus tachycardia  QRS Axis: right  Intervals: normal  ST/T Wave abnormalities: normal  Conduction Disutrbances:none  Narrative Interpretation: Anterior Q waves  Old EKG Reviewed: none available  I have personally reviewed the EKG tracing and agree with the computerized printout as noted.  Radiology DG Chest Port 1 View  Result Date: 03/01/2019 CLINICAL DATA:  Fever, cough EXAM: PORTABLE CHEST 1 VIEW COMPARISON:  03/04/2003 FINDINGS: The heart size and mediastinal contours are within normal limits. Chronic elevation of the right hemidiaphragm with mild right basilar atelectasis. Lungs appear otherwise clear. The visualized skeletal structures are unremarkable. IMPRESSION: No active disease. Electronically Signed   By: Davina Poke D.O.   On: 03/01/2019 12:11    Procedures Procedures (including critical care  time)  Medications Ordered in ED Medications  vancomycin (VANCOREADY) IVPB 1500 mg/300 mL (1,500 mg Intravenous New Bag/Given 03/01/19 1259)  vancomycin (VANCOCIN) IVPB 1000 mg/200 mL premix (has no administration in time range)  sodium chloride 0.9 % bolus 1,000 mL (0 mLs Intravenous Stopped 03/01/19 1250)  acetaminophen (TYLENOL) tablet 650 mg (650 mg Oral Given 03/01/19 1158)  sodium chloride 0.9 % bolus 1,000 mL (1,000 mLs Intravenous Bolus from Bag 03/01/19 1302)  ED Course  I have reviewed the triage vital signs and the nursing notes.  Pertinent labs & imaging results that were available during my care of the patient were reviewed by me and considered in my medical decision making (see chart for details).  Patient seen and examined. Patient with cellulitic rash, fever, tachycardia.  Sepsis work-up ordered.  Patient does not have any conjunctival or oral mucosal involvement to suggest SJS.  No suspicious medication exposures.  Will recheck Covid as well.  Will treat fever and reevaluate.  Vital signs reviewed and are as follows: BP 127/74   Pulse (!) 128   Temp (!) 100.6 F (38.1 C) (Oral)   Resp (!) 27   SpO2 96%   12:34 PM Rapid covid neg.   1:16 PM Updated patient and son by telephone. Will admit for sepsis, suspected cellulitis.   2:04 PM Discussed with IMTS. Patient received call in regards to negative send out COVID test.  I can see this result in Care Everywhere.    MDM Rules/Calculators/A&P                      Admit for IV abx.   CP was transient. Non-ischemic EKG. Atypical for ACS. CXR clear.     Final Clinical Impression(s) / ED Diagnoses Final diagnoses:  Sepsis without acute organ dysfunction, due to unspecified organism [facility])    Rx / DC Orders ED Discharge Orders    None       Carlisle Cater, PA-C 03/01/19 1406    Quintella Reichert, MD 03/03/19 [phone]

## 2019-03-01 NOTE — Progress Notes (Signed)
Patient arrived via stretcher. Oriented to room and call bell. Report received from [facility] (ED). Patient denies pain/discomfort. Patient complains of being cold, blankets and temperature adjusted. Afebrile. Call bell left in reach.

## 2019-03-01 NOTE — Progress Notes (Signed)
MD made aware of 102 temp. Patient was given Tylenol. No new orders at this time. Will recheck temp.

## 2019-03-01 NOTE — Progress Notes (Signed)
Pharmacy Antibiotic Note  Maureen Richardson is a 49 y.o. female admitted on 03/01/2019 with cellulitis.  Pharmacy has been consulted for vancomycin dosing.  Presenting with rash on head - sore to touch and red/swollen. WBC 17.2, temp 100.6, Scr 0.91 (CrCl 80 mL/min).   Plan: Vancomycin 1500 mg IV once then 1 g IV every 12 hours (est AUC 550)  Monitor renal fx, cx results, clinical pic, and vanc levels if deemed appropriate     Temp (24hrs), Avg:100.6 F (38.1 C), Min:100.6 F (38.1 C), Max:100.6 F (38.1 C)  Recent Labs  Lab 03/01/19 1119  WBC 17.2*  CREATININE 0.91    CrCl cannot be calculated (Unknown ideal weight.).    No Known Allergies  Antimicrobials this admission: Vancomycin 2/19 >>   Dose adjustments this admission: N/A  Microbiology results: 2/19 BCx: sent 2/19 Group A strep PCR: sent   Thank you for allowing pharmacy to be a part of this patient's care.  Antonietta Jewel, PharmD, BCCCP Clinical Pharmacist  Phone: [phone]  Please check AMION for all [place] phone numbers After 10:00 PM, call [place] [phone] 03/01/2019 12:38 PM

## 2019-03-02 DIAGNOSIS — Z6829 Body mass index (BMI) 29.0-29.9, adult: Secondary | ICD-10-CM | POA: Diagnosis not present

## 2019-03-02 DIAGNOSIS — F17210 Nicotine dependence, cigarettes, uncomplicated: Secondary | ICD-10-CM | POA: Diagnosis present

## 2019-03-02 DIAGNOSIS — L03211 Cellulitis of face: Secondary | ICD-10-CM | POA: Diagnosis present

## 2019-03-02 DIAGNOSIS — Z9221 Personal history of antineoplastic chemotherapy: Secondary | ICD-10-CM | POA: Diagnosis not present

## 2019-03-02 DIAGNOSIS — Z803 Family history of malignant neoplasm of breast: Secondary | ICD-10-CM | POA: Diagnosis not present

## 2019-03-02 DIAGNOSIS — N951 Menopausal and female climacteric states: Secondary | ICD-10-CM | POA: Diagnosis present

## 2019-03-02 DIAGNOSIS — E041 Nontoxic single thyroid nodule: Secondary | ICD-10-CM | POA: Diagnosis present

## 2019-03-02 DIAGNOSIS — H05019 Cellulitis of unspecified orbit: Secondary | ICD-10-CM | POA: Diagnosis not present

## 2019-03-02 DIAGNOSIS — Z8572 Personal history of non-Hodgkin lymphomas: Secondary | ICD-10-CM | POA: Diagnosis not present

## 2019-03-02 DIAGNOSIS — R197 Diarrhea, unspecified: Secondary | ICD-10-CM | POA: Diagnosis not present

## 2019-03-02 DIAGNOSIS — Z8249 Family history of ischemic heart disease and other diseases of the circulatory system: Secondary | ICD-10-CM | POA: Diagnosis not present

## 2019-03-02 DIAGNOSIS — A46 Erysipelas: Secondary | ICD-10-CM

## 2019-03-02 DIAGNOSIS — R590 Localized enlarged lymph nodes: Secondary | ICD-10-CM | POA: Diagnosis not present

## 2019-03-02 DIAGNOSIS — Z20822 Contact with and (suspected) exposure to covid-19: Secondary | ICD-10-CM | POA: Diagnosis present

## 2019-03-02 DIAGNOSIS — E669 Obesity, unspecified: Secondary | ICD-10-CM | POA: Diagnosis present

## 2019-03-02 DIAGNOSIS — R21 Rash and other nonspecific skin eruption: Secondary | ICD-10-CM | POA: Diagnosis present

## 2019-03-02 DIAGNOSIS — A419 Sepsis, unspecified organism: Secondary | ICD-10-CM | POA: Diagnosis present

## 2019-03-02 LAB — CBC
HCT: 38.6 % (ref 36.0–46.0)
Hemoglobin: 12.8 g/dL (ref 12.0–15.0)
MCH: 31 pg (ref 26.0–34.0)
MCHC: 33.2 g/dL (ref 30.0–36.0)
MCV: 93.5 fL (ref 80.0–100.0)
Platelets: 212 10*3/uL (ref 150–400)
RBC: 4.13 MIL/uL (ref 3.87–5.11)
RDW: 12.5 % (ref 11.5–15.5)
WBC: 12.2 10*3/uL — ABNORMAL HIGH (ref 4.0–10.5)
nRBC: 0 % (ref 0.0–0.2)

## 2019-03-02 LAB — BASIC METABOLIC PANEL
Anion gap: 5 (ref 5–15)
BUN: 7 mg/dL (ref 6–20)
CO2: 20 mmol/L — ABNORMAL LOW (ref 22–32)
Calcium: 7.8 mg/dL — ABNORMAL LOW (ref 8.9–10.3)
Chloride: 107 mmol/L (ref 98–111)
Creatinine, Ser: 0.76 mg/dL (ref 0.44–1.00)
GFR calc Af Amer: >60 mL/min (ref >60)
GFR calc non Af Amer: >60 mL/min (ref >60)
Glucose, Bld: 155 mg/dL — ABNORMAL HIGH (ref 70–99)
Potassium: 3.4 mmol/L — ABNORMAL LOW (ref 3.5–5.1)
Sodium: 132 mmol/L — ABNORMAL LOW (ref 135–145)

## 2019-03-02 LAB — TSH: TSH: 1.53 u[IU]/mL (ref 0.350–4.500)

## 2019-03-02 LAB — MRSA PCR SCREENING: MRSA by PCR: NEGATIVE

## 2019-03-02 MED ORDER — CEFAZOLIN SODIUM-DEXTROSE 2-4 GM/100ML-% IV SOLN
2.0000 g | Freq: Three times a day (TID) | INTRAVENOUS | Status: DC
  Administered 2019-03-02 – 2019-03-03 (×3): 2 g via INTRAVENOUS
  Filled 2019-03-02 (×5): qty 100

## 2019-03-02 MED ORDER — POTASSIUM CHLORIDE CRYS ER 20 MEQ PO TBCR
30.0000 meq | EXTENDED_RELEASE_TABLET | Freq: Two times a day (BID) | ORAL | Status: AC
  Administered 2019-03-02 (×2): 30 meq via ORAL
  Filled 2019-03-02 (×2): qty 1

## 2019-03-02 MED ORDER — SODIUM CHLORIDE 0.9% FLUSH: 10.0000 mL | INTRAVENOUS | Status: DC | PRN

## 2019-03-02 MED ORDER — KETOROLAC TROMETHAMINE 10 MG PO TABS: 10.0000 mg | ORAL_TABLET | Freq: Four times a day (QID) | ORAL | Status: DC | PRN

## 2019-03-02 MED ORDER — KETOROLAC TROMETHAMINE 15 MG/ML IJ SOLN
15.0000 mg | Freq: Four times a day (QID) | INTRAMUSCULAR | Status: DC | PRN
  Administered 2019-03-02 – 2019-03-05 (×7): 15 mg via INTRAVENOUS
  Filled 2019-03-02 (×8): qty 1

## 2019-03-02 MED ORDER — SODIUM CHLORIDE 0.9% FLUSH
10.0000 mL | Freq: Two times a day (BID) | INTRAVENOUS | Status: DC
  Administered 2019-03-02 – 2019-03-06 (×5): 10 mL

## 2019-03-02 NOTE — Progress Notes (Addendum)
Subjective: HD#1 Overnight, patient noted to be febrile to 102 and also noted to have increased swelling on forehead and right eye. Tylenol administered.   Patient evaluated at bedside this morning. Patient reports that she is not doing well today. Now having difficulty opening her right eye, now having some puffiness in her left eye. Reports that her whole back of the head has been getting swollen. Still having tender lymph nodes. Denies any eye pain with movement.   Objective:  Vital signs in last 24 hours: Vitals:   03/01/19 1830 03/01/19 2147 03/02/19 0500 03/02/19 0546  BP:  96/67  111/61  Pulse:  95  67  Resp:  15  16  Temp: 99.7 F (37.6 C) 98 F (36.7 C)  98.9 F (37.2 C)  TempSrc: Oral Oral  Oral  SpO2:  96%  98%  Weight:   83.2 kg   Height:       CBC Latest Ref Rng & Units 03/02/2019 03/01/2019 05/30/2016  WBC 4.0 - 10.5 K/uL 12.2(H) 17.2(H) 8.2  Hemoglobin 12.0 - 15.0 g/dL 12.8 16.3(H) 14.1  Hematocrit 36.0 - 46.0 % 38.6 49.4(H) 41.5  Platelets 150 - 400 K/uL 212 230 Clumped Platelets--Appears Adequate   BMP Latest Ref Rng & Units 03/02/2019 03/01/2019 05/30/2016  Glucose 70 - 99 mg/dL 155(H) 131(H) 99  BUN 6 - 20 mg/dL 7 11 9.5  Creatinine 0.44 - 1.00 mg/dL 0.76 0.91 0.8  Sodium 135 - 145 mmol/L 132(L) 133(L) 140  Potassium 3.5 - 5.1 mmol/L 3.4(L) 4.4 3.7  Chloride 98 - 111 mmol/L 107 101 -  CO2 22 - 32 mmol/L 20(L) 19(L) 25  Calcium 8.9 - 10.3 mg/dL 7.8(L) 9.1 8.9   Physical Exam  Constitutional: She is oriented to person, place, and time and well-developed, well-nourished, and in no distress.  HENT:  Head: Normocephalic and atraumatic.  Right Ear: External ear normal.  Left Ear: External ear normal.  Eyes: Conjunctivae and EOM are normal. Left eye exhibits discharge. No scleral icterus.  Clear discharge noted from left eye   Neck: No tracheal deviation present.  Bilateral tender posterior cervical, posterior auricular, submandibular lymphadenopathy    Cardiovascular: Normal rate, regular rhythm, normal heart sounds and intact distal pulses.  Pulmonary/Chest: Effort normal and breath sounds normal. No respiratory distress. She has no wheezes. She has no rales.  Abdominal: Soft. Bowel sounds are normal. She exhibits no distension. There is no abdominal tenderness.  Musculoskeletal:        General: No tenderness or edema. Normal range of motion.     Cervical back: Normal range of motion and neck supple.  Lymphadenopathy:    She has cervical adenopathy.  Neurological: She is alert and oriented to person, place, and time. No cranial nerve deficit.  Skin: Skin is warm and dry. Rash noted. There is erythema.  Tenderness, erythema and warmth noted along forehead in demarcated pattern Edema extended to right and left eye (R>L)     Assessment/Plan: [name] is a 49 yr old female with history of non-Hodgkin B cell lymphoma s/p chemotherapy in 2004 presenting with low-grade fevers, generalized body aches and tender lymphadenopathy since Wednesday. Noted to have a cellulitic rash with leukocytosis, tachycardia and fever concerning for sepsis.   Sepsis 2/2 Erysipelas: Patient initially admitted with rash, leukocytosis, tachycardia and fever concerning for sepsis and started on IV vancomycin. Overnight, febrile to 102 with worsening of cellulitis to right eye. This morning, patient noted to have worsening cellulitis that is erythematous,  warm, tender to palpation in demarcated pattern with worsening edema extending to the bilateral eyes (R>L). She also has similar rash on the back of her neck that is very demarcated and tender to palpation. Continues to have tender lymphadenopathy. Leukocytosis and lactic acid improved. Vitals currently stable. Blood cultures negative thus far. As it is mostly caused by GAS, will change antibiotics to cefazolin.  Currently, patient does not have any eye pain but is at risk for developing orbital cellulitis. If she  does develop eye pain, decreased visual acuity or conjunctival  Hyperemia/chemosis, would get CT orbit for further evaluation.  - IV cefazolin 2g q8h - F/u blood cultures - IV Toradol 15mg  q6h prn   Tender lymphadenopathy: Patient with history of NH B-cell lymphoma in 2003 s/p chemotherapy with R-CHOP completed in 2004. She notes tender lymphadenopathy since Wednesday and continues to have tender posterior cervical, posterior auricular and submandibular adenopathy. No history of weight loss. At this time, likely that her tender lyphadenopathy likely secondary to ongoing infection. However, if fails to improve, would recommend further oncologic work up.  - Continue to monitor  FEN/GI: Diet: Regular Fluids: None Electrolytes: Monitor and replete prn  VTE Prophylaxis: Lovenox 40mg  daily  Code: FULL  Prior to Admission Living Arrangement: Home Anticipated Discharge Location: Home Barriers to Discharge: Continued medical management  Dispo: Anticipated discharge in approximately 2-3 day(s).   Harvie Heck, MD  Internal Medicine, PGY-1 Pager: [phone] 03/02/2019, 6:49 AM

## 2019-03-02 NOTE — Progress Notes (Addendum)
Pharmacy Antibiotic Note  Maureen Richardson is a 49 y.o. female admitted on 03/01/2019 with Erysipelas.  Pharmacy has been consulted for cefazolin dosing. Patient was previously on vancomycin, total of 2500mg  last 24 hours, received last 1g dose at 0200 this AM. Patient has downward trending WBC 17.2>12.2, stable creatinine at 0.76. afebrile.   Plan: Start Cefazolin IV 2 gram every 8 hours starting at 1200 today F/u LOT, PO transition  Height: 5\' 6"  (167.6 cm) Weight: 183 lb 6.8 oz (83.2 kg) IBW/kg (Calculated) : 59.3  Temp (24hrs), Avg:99.6 F (37.6 C), Min:98 F (36.7 C), Max:102 F (38.9 C)  Recent Labs  Lab 03/01/19 1119 03/01/19 1207 03/01/19 1528 03/01/19 1747 03/02/19 0307  WBC 17.2*  --   --   --  12.2*  CREATININE 0.91  --   --   --  0.76  LATICACIDVEN  --  2.6* 1.4 1.5  --     Estimated Creatinine Clearance: 94.6 mL/min (by C-G formula based on SCr of 0.76 mg/dL).    No Known Allergies  Antimicrobials this admission: Vanc 2/19>2/20  Dose adjustments this admission:  Microbiology results: 2/19 BCx: pending 2/20 MRSA PCR: sent  Thank you for the interesting consult and for involving pharmacy in this patient's care.  Tamela Gammon, PharmD, BCPS 03/02/2019 10:22 AM PGY-2 Pharmacy Administration Resident Please check [email or web address] for unit-specific pharmacist phone numbers

## 2019-03-02 NOTE — Progress Notes (Signed)
Pt woke up around 0200 d/t pain and increase swelling on forehead which now affected pt's right eye.  Administered PRN pain medication tylenol per order.  Will monitor.

## 2019-03-02 NOTE — Progress Notes (Signed)
Paged by RN regarding worsening facial edema. Patient evaluated at bedside. Noted to have worsening edema of left eye. However, erythema appears stable (see picture in media). Erythema on the back of neck not significantly changed from prior exam. Patient's vision is stable from prior. No conjunctival injection noted on exam. Patient is able to open her eyes and on exam, extraocular eye movements intact without any significant discomfort. Cranial nerves II-XII intact. At this time, low suspicion for orbital cellulitis given benign eye exam.   Plan: - Continue IV Ancef 2g q8h - Continue IV Toradol 15mg  q6h prn for pain - Continue tylenol 650mg  q6h prn for mild pain and fever

## 2019-03-03 ENCOUNTER — Inpatient Hospital Stay (HOSPITAL_COMMUNITY): Payer: BC Managed Care – PPO

## 2019-03-03 ENCOUNTER — Encounter (HOSPITAL_COMMUNITY): Payer: Self-pay | Admitting: Internal Medicine

## 2019-03-03 DIAGNOSIS — Z9221 Personal history of antineoplastic chemotherapy: Secondary | ICD-10-CM

## 2019-03-03 DIAGNOSIS — Z8572 Personal history of non-Hodgkin lymphomas: Secondary | ICD-10-CM

## 2019-03-03 DIAGNOSIS — R197 Diarrhea, unspecified: Secondary | ICD-10-CM

## 2019-03-03 DIAGNOSIS — L03211 Cellulitis of face: Secondary | ICD-10-CM

## 2019-03-03 LAB — CBC
HCT: 35.3 % — ABNORMAL LOW (ref 36.0–46.0)
Hemoglobin: 11.6 g/dL — ABNORMAL LOW (ref 12.0–15.0)
MCH: 31.1 pg (ref 26.0–34.0)
MCHC: 32.9 g/dL (ref 30.0–36.0)
MCV: 94.6 fL (ref 80.0–100.0)
Platelets: 202 10*3/uL (ref 150–400)
RBC: 3.73 MIL/uL — ABNORMAL LOW (ref 3.87–5.11)
RDW: 12.8 % (ref 11.5–15.5)
WBC: 10.2 10*3/uL (ref 4.0–10.5)
nRBC: 0 % (ref 0.0–0.2)

## 2019-03-03 LAB — BASIC METABOLIC PANEL
Anion gap: 9 (ref 5–15)
BUN: 7 mg/dL (ref 6–20)
CO2: 24 mmol/L (ref 22–32)
Calcium: 7.9 mg/dL — ABNORMAL LOW (ref 8.9–10.3)
Chloride: 108 mmol/L (ref 98–111)
Creatinine, Ser: 0.62 mg/dL (ref 0.44–1.00)
GFR calc Af Amer: >60 mL/min (ref >60)
GFR calc non Af Amer: >60 mL/min (ref >60)
Glucose, Bld: 98 mg/dL (ref 70–99)
Potassium: 3.9 mmol/L (ref 3.5–5.1)
Sodium: 141 mmol/L (ref 135–145)

## 2019-03-03 MED ORDER — SODIUM CHLORIDE 0.9 % IV SOLN
2.0000 g | Freq: Three times a day (TID) | INTRAVENOUS | Status: DC
  Administered 2019-03-03 – 2019-03-04 (×3): 2 g via INTRAVENOUS
  Filled 2019-03-03 (×5): qty 2

## 2019-03-03 MED ORDER — IOHEXOL 300 MG/ML  SOLN
75.0000 mL | Freq: Once | INTRAMUSCULAR | Status: AC | PRN
  Administered 2019-03-03: 17:00:00 75 mL via INTRAVENOUS

## 2019-03-03 MED ORDER — GADOBUTROL 1 MMOL/ML IV SOLN
8.0000 mL | Freq: Once | INTRAVENOUS | Status: AC | PRN
  Administered 2019-03-03: 14:00:00 8 mL via INTRAVENOUS

## 2019-03-03 MED ORDER — METRONIDAZOLE 500 MG PO TABS
500.0000 mg | ORAL_TABLET | Freq: Four times a day (QID) | ORAL | Status: DC
  Administered 2019-03-03: 15:00:00 500 mg via ORAL
  Filled 2019-03-03: qty 1

## 2019-03-03 MED ORDER — DIPHENHYDRAMINE HCL 25 MG PO CAPS
25.0000 mg | ORAL_CAPSULE | Freq: Once | ORAL | Status: AC
  Administered 2019-03-03: 10:00:00 25 mg via ORAL
  Filled 2019-03-03: qty 1

## 2019-03-03 MED ORDER — DIPHENHYDRAMINE HCL 25 MG PO CAPS
25.0000 mg | ORAL_CAPSULE | Freq: Four times a day (QID) | ORAL | Status: DC | PRN
  Administered 2019-03-03 – 2019-03-05 (×5): 25 mg via ORAL
  Filled 2019-03-03 (×6): qty 1

## 2019-03-03 NOTE — Consult Note (Addendum)
Date of Admission:  03/01/2019          Reason for Consult: Worsening facial cellultis   Referring Provider: Dr Rebeca Alert   Assessment:  1. Facial cellulitis but with concern for deeper infection of CNS, orbits 2. Hx of B cell lymphoma 3. Recent eyebrow treatment  Plan:  1. Get MRI of the brain 2. Change to cefepime and  flagyl  in case this is brain abscess or severe orbital infection 3. Consider adding back vancomycin if she has a brain abscess 4. Screen for HCV 5. ESR, CRP  Dr. Baxter Flattery to see tomorrow when she takes on the service.    Principal Problem:   Erysipelas Active Problems:   Cellulitis   Sepsis ([place])   Scheduled Meds: . enoxaparin (LOVENOX) injection  40 mg Subcutaneous Q24H  . sodium chloride flush  10-40 mL Intracatheter Q12H   Continuous Infusions: .  ceFAZolin (ANCEF) IV 2 g (03/03/19 0540)   PRN Meds:.acetaminophen **OR** acetaminophen, ketorolac, sodium chloride flush  HPI: Maureen Richardson is a 49 y.o. female with past medical history significant for non-Hodgkin's B-cell lymphoma status post chemotherapy in 2004, obesity who presented with fever and worsening erythema.  She tells me that she had recently had her eyebrows waxed and that the following day she began experiencing swollen lymph nodes in her neck low-grade fevers.  She saw her primary care physician was told to get take Tylenol.  She was tested for Covid a few times in the meantime.  She had one episode of watery loose stools.  On the morning of admission the 19th she woke up and noted that her entire forehead was erythematous and tender to the touch.  She was admitted into the internal medicine service  Blood cultures were taken.  Treated initially with vancomycin then narrowed to cefazolin but has had progressive erythema and swelling of her face.  I am concerned that she may have a deeper infection such as of her orbits or in brain itself.  I will change her to cefepime,  And  flagyl given concerns waxing could have put her for pseudomonal infection. Noland Fordyce is very strange  Will likely add back vancomycin if brain abscess      Review of Systems: Review of Systems  Constitutional: Positive for fever. Negative for chills, diaphoresis, malaise/fatigue and weight loss.  HENT: Negative for congestion, hearing loss, sore throat and tinnitus.   Eyes: Negative for blurred vision and double vision.  Respiratory: Negative for cough, sputum production, shortness of breath and wheezing.   Cardiovascular: Negative for chest pain, palpitations and leg swelling.  Gastrointestinal: Negative for abdominal pain, blood in stool, constipation, diarrhea, heartburn, melena, nausea and vomiting.  Genitourinary: Negative for dysuria, flank pain and hematuria.  Musculoskeletal: Positive for back pain. Negative for falls, joint pain and myalgias.  Skin: Negative for itching and rash.  Neurological: Negative for dizziness, sensory change, focal weakness, loss of consciousness, weakness and headaches.  Endo/Heme/Allergies: Does not bruise/bleed easily.  Psychiatric/Behavioral: Negative for depression, memory loss and suicidal ideas. The patient is not nervous/anxious.     History reviewed. No pertinent past medical history.  Social History   Tobacco Use  . Smoking status: Not on file  Substance Use Topics  . Alcohol use: Not on file  . Drug use: Not on file    No family history on file. No Known Allergies  OBJECTIVE: Blood pressure 103/67, pulse 66, temperature 98.4 F (36.9 C), temperature source Oral, resp. rate  16, height '5\' 6"'  (1.676 m), weight 83.2 kg, SpO2 99 %.  Physical Exam Constitutional:      Appearance: She is obese.  Cardiovascular:     Rate and Rhythm: Normal rate and regular rhythm.  Pulmonary:     Effort: Pulmonary effort is normal. No respiratory distress.     Breath sounds: No wheezing.  Abdominal:     General: Abdomen is flat. There is no  distension.  Musculoskeletal:     Cervical back: Normal range of motion.  Skin:    Findings: Erythema present.  Neurological:     General: No focal deficit present.     Mental Status: She is alert.  Psychiatric:        Mood and Affect: Mood normal.        Behavior: Behavior normal.        Thought Content: Thought content normal.        Judgment: Judgment normal.    Neck      Face             Lab Results Lab Results  Component Value Date   WBC 10.2 03/03/2019   HGB 11.6 (L) 03/03/2019   HCT 35.3 (L) 03/03/2019   MCV 94.6 03/03/2019   PLT 202 03/03/2019    Lab Results  Component Value Date   CREATININE 0.62 03/03/2019   BUN 7 03/03/2019   NA 141 03/03/2019   K 3.9 03/03/2019   CL 108 03/03/2019   CO2 24 03/03/2019    Lab Results  Component Value Date   ALT 37 03/01/2019   AST 31 03/01/2019   ALKPHOS 80 03/01/2019   BILITOT 1.0 03/01/2019     Microbiology: Recent Results (from the past 240 hour(s))  Group A Strep by PCR     Status: None   Collection Time: 03/01/19 11:35 AM   Specimen: Throat; Sterile Swab  Result Value Ref Range Status   Group A Strep by PCR NOT DETECTED NOT DETECTED Final    Comment: Performed at [facility], [address] [place], [place] [address]  Blood culture (routine x 2)     Status: None (Preliminary result)   Collection Time: 03/01/19  1:18 PM   Specimen: BLOOD LEFT HAND  Result Value Ref Range Status   Specimen Description BLOOD LEFT HAND  Final   Special Requests   Final    BOTTLES DRAWN AEROBIC AND ANAEROBIC Blood Culture adequate volume Performed at [facility], [place] [address], [place], [place] [address]    Culture NO GROWTH < 24 HOURS  Final   Report Status PENDING  Incomplete  Blood culture (routine x 2)     Status: None (Preliminary result)   Collection Time: 03/01/19  1:18 PM   Specimen: BLOOD RIGHT HAND  Result Value Ref Range Status   Specimen Description BLOOD RIGHT HAND  Final    Special Requests   Final    BOTTLES DRAWN AEROBIC AND ANAEROBIC Blood Culture results may not be optimal due to an inadequate volume of blood received in culture bottles Performed at [facility], [place] [address] [place], [place] [address]    Culture NO GROWTH < 24 HOURS  Final   Report Status PENDING  Incomplete  MRSA PCR Screening     Status: None   Collection Time: 03/02/19  8:51 AM   Specimen: Nasopharyngeal  Result Value Ref Range Status   MRSA by PCR NEGATIVE NEGATIVE Final    Comment:  The GeneXpert MRSA Assay (FDA approved for NASAL specimens only), is one component of a comprehensive MRSA colonization surveillance program. It is not intended to diagnose MRSA infection nor to guide or monitor treatment for MRSA infections. Performed at [facility], [place] [address], [place], [place] [address]     Alcide Evener, [place] [phone] pager  03/03/2019, 1:28 PM

## 2019-03-03 NOTE — Progress Notes (Addendum)
Pharmacy Antibiotic Note  Maureen Richardson is a 49 y.o. female admitted on 03/01/2019 with facial cellulitis, now worsening with suspected brain abscess.  Pharmacy has been consulted for cefepime dosing.  WBC was elevated at 17.2 on admission but is now WNL at 10.2. Tmax 102 F. Scr 0.62 and stable. Lactate 1.5.   Plan: - Will discontinue cefazolin and start cefepime 2g IV q8hr - Monitor renal function, cultures/sensitivities - Flagyl per ID - F/u brain MRI, length of therapy plans, opportunity for antimicrobial deescalation  Height: 5\' 6"  (167.6 cm) Weight: 183 lb 6.8 oz (83.2 kg) IBW/kg (Calculated) : 59.3  Temp (24hrs), Avg:99.1 F (37.3 C), Min:98.1 F (36.7 C), Max:102.3 F (39.1 C)  Recent Labs  Lab 03/01/19 1119 03/01/19 1207 03/01/19 1528 03/01/19 1747 03/02/19 0307 03/03/19 0401  WBC 17.2*  --   --   --  12.2* 10.2  CREATININE 0.91  --   --   --  0.76 0.62  LATICACIDVEN  --  2.6* 1.4 1.5  --   --     Estimated Creatinine Clearance: 94.6 mL/min (by C-G formula based on SCr of 0.62 mg/dL).    No Known Allergies  Antimicrobials this admission: Cefazolin 2/20 >> 2/21 Cefepime 2/21 >>  Flagyl 2/21 >>  Dose adjustments this admission:  Microbiology results: 2/19 BCx: NG x24hr 2/20 MRSA PCR: negative  Thank you for allowing pharmacy to be a part of this patient's care.  Agnes Lawrence, PharmD PGY1 Pharmacy Resident

## 2019-03-03 NOTE — Progress Notes (Signed)
   Subjective: Patient reports that she is still having a lot of swelling on her facial area, feels that it is spreading more. It is now around her left eye. She reports that there is some itchiness now after starting the new antibiotics. She denies any eye pain, vision changes, or changes in discharge, still with clear discharge.   Objective:  Vital signs in last 24 hours: Vitals:   03/02/19 1835 03/02/19 2024 03/03/19 0008 03/03/19 0404  BP: (!) 101/55 (!) 113/52 112/64 111/66  Pulse: 72 68 75 63  Resp: 16  17 17   Temp: 98.4 F (36.9 C) 98.5 F (36.9 C) 98.1 F (36.7 C) 98.4 F (36.9 C)  TempSrc: Oral Oral Oral Oral  SpO2: 98% 100% 98% 97%  Weight:      Height:        General: Middle aged female, NAD, sitting up in bed HEENT: Diffuse swelling and erythema on upper forehead and around orbits, posterior neck with papules Cardiac: RRR, no m/r/g Pulmonary: CTABL, no wheezing, rhonchi, rales Abdomen: Soft, non-tender, non-distended        Assessment/Plan:  Principal Problem:   Erysipelas Active Problems:   Cellulitis   Sepsis ([place])  This is a 49 year old female with a history of NHBCL s/p chemothearpy in 2004 who presented with fevers, generalized body aches, tender lymphadenopathy, and erythematous facial rash.   Sepsis 2/2 presumed erysipelas: Patient noted to have worsening swelling and spread of the rash and was transitioned to cefazolin yesterday. Had a fever yesterday around 3PM, leukocytosis has improved to 10 from 12. Today she has worsening swelling and erythema noted. Started having itchiness around the area. Rash does still seem to have sharp demarcation and appears more superficial making erysipelas a possibility however it's unclear why it continues to spread. She may need her antibiotics adjusted for broader spectrum coverage. Other possibilities include cellulitis, contact dermatitis, drug reaction, urticaria, or a skin infection 2/2 unknown organism. She does  report some increased pruritis today, while it seems unlikely there could be an allergic component contributing, will trial a short course of benadryl. Of note she does not have any eye pain, vision changes, conjunctival injection, or chemosis. Will continue to monitor for pre-septal or orbital cellulitis with low threshold of obtaining CT orbit.   -Continue cefazolin 2g q8 hours -Blood cultures NGTD x24 hrs -Consult ID, appreciate recommendations -Obtaining CT head,neck,maxillofacial -Continue IV toradol PRN for pain control -Benadryl 25 daily -Continue to trend fever curve and CBC  Lymphadenopathy: -Likely reactive 2/2 sepsis 2/2 erysipelas. She has a history of NHBCL s/p chemotherapy with R-CHOP 2004. Also reporting some night sweats, decreased appeitite. Will need to continue following up with oncology to have repeat PET scan.   FEN/GI: Diet: Regular Fluids: None Electrolytes: Monitor and replete prn  VTE Prophylaxis: Lovenox 40mg  daily  Code: FULL  Prior to Admission Living Arrangement: Home Anticipated Discharge Location: Home Barriers to Discharge: Continued medical management  Dispo: Anticipated discharge in approximately 1-2 day(s).   Asencion Noble, MD 03/03/2019, 6:52 AM Pager: [phone]

## 2019-03-03 NOTE — Progress Notes (Signed)
      INFECTIOUS DISEASE ATTENDING ADDENDUM:   Date: 03/03/2019  Patient name: Maureen Richardson  Medical record number: [id number]  Date of birth: 14-May-1970   MRI brain normal  Continue cefepime to cover pseudomonas given concern about assoc with waxing and lack of response to vanco/cefazolin  DC flagyl   Rhina Brackett Dam 03/03/2019, 4:00 PM

## 2019-03-04 DIAGNOSIS — H05019 Cellulitis of unspecified orbit: Secondary | ICD-10-CM

## 2019-03-04 DIAGNOSIS — R21 Rash and other nonspecific skin eruption: Secondary | ICD-10-CM

## 2019-03-04 LAB — CBC
HCT: 35.6 % — ABNORMAL LOW (ref 36.0–46.0)
Hemoglobin: 11.6 g/dL — ABNORMAL LOW (ref 12.0–15.0)
MCH: 31.1 pg (ref 26.0–34.0)
MCHC: 32.6 g/dL (ref 30.0–36.0)
MCV: 95.4 fL (ref 80.0–100.0)
Platelets: 220 10*3/uL (ref 150–400)
RBC: 3.73 MIL/uL — ABNORMAL LOW (ref 3.87–5.11)
RDW: 13.1 % (ref 11.5–15.5)
WBC: 7.7 10*3/uL (ref 4.0–10.5)
nRBC: 0 % (ref 0.0–0.2)

## 2019-03-04 LAB — CBC WITH DIFFERENTIAL/PLATELET
Abs Immature Granulocytes: 0.04 10*3/uL (ref 0.00–0.07)
Basophils Absolute: 0 10*3/uL (ref 0.0–0.1)
Basophils Relative: 0 %
Eosinophils Absolute: 0.2 10*3/uL (ref 0.0–0.5)
Eosinophils Relative: 2 %
HCT: 35.7 % — ABNORMAL LOW (ref 36.0–46.0)
Hemoglobin: 11.9 g/dL — ABNORMAL LOW (ref 12.0–15.0)
Immature Granulocytes: 1 %
Lymphocytes Relative: 18 %
Lymphs Abs: 1.4 10*3/uL (ref 0.7–4.0)
MCH: 31.5 pg (ref 26.0–34.0)
MCHC: 33.3 g/dL (ref 30.0–36.0)
MCV: 94.4 fL (ref 80.0–100.0)
Monocytes Absolute: 0.9 10*3/uL (ref 0.1–1.0)
Monocytes Relative: 12 %
Neutro Abs: 5.3 10*3/uL (ref 1.7–7.7)
Neutrophils Relative %: 67 %
Platelets: 225 10*3/uL (ref 150–400)
RBC: 3.78 MIL/uL — ABNORMAL LOW (ref 3.87–5.11)
RDW: 13.1 % (ref 11.5–15.5)
WBC: 7.8 10*3/uL (ref 4.0–10.5)
nRBC: 0 % (ref 0.0–0.2)

## 2019-03-04 LAB — BASIC METABOLIC PANEL
Anion gap: 7 (ref 5–15)
BUN: 10 mg/dL (ref 6–20)
CO2: 25 mmol/L (ref 22–32)
Calcium: 8.3 mg/dL — ABNORMAL LOW (ref 8.9–10.3)
Chloride: 109 mmol/L (ref 98–111)
Creatinine, Ser: 0.65 mg/dL (ref 0.44–1.00)
GFR calc Af Amer: >60 mL/min (ref >60)
GFR calc non Af Amer: >60 mL/min (ref >60)
Glucose, Bld: 103 mg/dL — ABNORMAL HIGH (ref 70–99)
Potassium: 4.2 mmol/L (ref 3.5–5.1)
Sodium: 141 mmol/L (ref 135–145)

## 2019-03-04 MED ORDER — CLINDAMYCIN PHOSPHATE 600 MG/50ML IV SOLN
600.0000 mg | Freq: Three times a day (TID) | INTRAVENOUS | Status: AC
  Administered 2019-03-04 – 2019-03-05 (×5): 600 mg via INTRAVENOUS
  Filled 2019-03-04 (×5): qty 50

## 2019-03-04 MED ORDER — PREDNISONE 20 MG PO TABS: 40.0000 mg | ORAL_TABLET | Freq: Every day | ORAL | Status: DC

## 2019-03-04 MED ORDER — PREDNISONE 20 MG PO TABS
40.0000 mg | ORAL_TABLET | Freq: Every day | ORAL | Status: AC
  Administered 2019-03-04 – 2019-03-05 (×2): 40 mg via ORAL
  Filled 2019-03-04 (×2): qty 2

## 2019-03-04 MED ORDER — CEFAZOLIN SODIUM-DEXTROSE 2-4 GM/100ML-% IV SOLN
2.0000 g | Freq: Three times a day (TID) | INTRAVENOUS | Status: DC
  Administered 2019-03-04 – 2019-03-05 (×3): 2 g via INTRAVENOUS
  Filled 2019-03-04 (×4): qty 100

## 2019-03-04 NOTE — Progress Notes (Signed)
[facility] for Infectious Disease    Date of Admission:  03/01/2019   Total days of antibiotics 4           ID: Maureen Richardson is a 49 y.o. female with facial cellulitis Principal Problem:   Erysipelas Active Problems:   Cellulitis   Sepsis ([place])    Subjective: She reports slight improvement in swelling to eyes. When recounting her history she initially had pain at nap of neck and vesicular like rash occur,followed by chills, subjective fevers then forehead erythema swelling spreading to eyes and cheeks. No injury that she can recall to initiate this, no insect bite. No hx of shingles. She has hx of psoriasis  ROS: still some facial tenderness  Medications:  . enoxaparin (LOVENOX) injection  40 mg Subcutaneous Q24H  . predniSONE  40 mg Oral Q breakfast  . sodium chloride flush  10-40 mL Intracatheter Q12H    Objective: Vital signs in last 24 hours: Temp:  [98.2 F (36.8 C)-98.4 F (36.9 C)] 98.2 F (36.8 C) (02/22 1441) Pulse Rate:  [64-68] 64 (02/22 1441) Resp:  [18-20] 18 (02/22 1441) BP: (110-116)/(53-60) 116/60 (02/22 1441) SpO2:  [98 %] 98 % (02/22 1441) Weight:  [82.9 kg] 82.9 kg (02/22 0500) Physical Exam  Constitutional:  oriented to person, place, and time. appears well-developed and well-nourished. No distress.  HENT: [place]/AT, PERRLA, no scleral icterus, no conjunctivitis Mouth/Throat: Oropharynx is clear and moist. No oropharyngeal exudate.  Skin = nape of neck has erythematous plaque with small cluster of vesicles on superior border, coalesced in the center. Blanching rash to forehead and cheeks, no vesicles.  Neuro = EOMI no pain  LAD: preauricular LN on L > R   Lab Results Recent Labs    03/03/19 0401 03/04/19 0431  WBC 10.2 7.8  7.7  HGB 11.6* 11.9*  11.6*  HCT 35.3* 35.7*  35.6*  NA 141 141  K 3.9 4.2  CL 108 109  CO2 24 25  BUN 7 10  CREATININE 0.62 0.65    Microbiology: engative Studies/Results: CT SOFT TISSUE NECK W  CONTRAST  Result Date: 03/03/2019 CLINICAL DATA:  49 year old female with history of treated B-cell lymphoma a decade or more ago. Recent low-grade fever, body ache. EXAM: CT NECK WITH CONTRAST TECHNIQUE: Multidetector CT imaging of the neck was performed using the standard protocol following the bolus administration of intravenous contrast. CONTRAST:  83mL OMNIPAQUE IOHEXOL 300 MG/ML  SOLN COMPARISON:  Brain MRI today reported separately. Neck CT 02/06/2003. PET-CT 08/23/2005. Cervical spine MRI 05/26/2016. FINDINGS: Pharynx and larynx: Laryngeal and pharyngeal soft tissue contours remain normal. Negative parapharyngeal and retropharyngeal spaces. Salivary glands: Negative sublingual space. Submandibular glands and parotid glands appear stable and within normal limits. Thyroid: There is a new 26 mm mostly hypodense but mildly heterogeneous thyroid nodule on the right since 2005. However, size of this nodule appears stable since the 2018 MRI. And this was characterized with thyroid ultrasound on 07/04/2016. Isthmus and left lobe appear negative. Lymph nodes: The level 1 and level 2A lymph nodes appear stable and within normal limits since 2005. There are small but rounded bilateral level 5 or level IIIb nodes, the largest is 11 mm short axis on series 3, image 50, and these are increased in size and number since 2005. However, the largest node was 9-10 mm in 2018. No other new or increased lymph nodes in the neck. Vascular: Major vascular structures in the neck and at the skull base are  patent. The left IJ appears dominant as before. Partially retropharyngeal course of both carotid bifurcations. Limited intracranial: Stable, negative. Visualized orbits: Negative. Mastoids and visualized paranasal sinuses: Left maxillary sinus mucous retention cyst and trace paranasal sinus mucosal thickening. Tympanic cavities and mastoids are clear. Skeleton: Prior cervical ACDF. Associated solid arthrodesis. Reversal of cervical  lordosis. No acute or suspicious osseous lesion identified. Upper chest: Respiratory motion. No upper lung nodule. No definite superior mediastinal lymphadenopathy. No axillary lymphadenopathy. Other: Generalized face and scalp subcutaneous stranding as seen on series 3, image 27. IMPRESSION: 1. Small but conspicuous bilateral level 5/3B lymph nodes are increased from a 2005 neck CT, but are fairly stable spine from a 2018 cervical spine MRI - arguing against active lymphoma. Other lymph nodes are within normal limits. 2. Nonspecific generalized scalp and face subcutaneous edema. 3. Prior cervical spine ACDF. Electronically Signed   By: Genevie Ann M.D.   On: 03/03/2019 17:35   MR BRAIN W WO CONTRAST  Result Date: 03/03/2019 CLINICAL DATA:  49 year old female with history of treated B-cell lymphoma a decade or more ago. Recent low-grade fever, recently negative for COVID-19 and flu. Generalized body aches. Forehead pain. EXAM: MRI HEAD WITHOUT AND WITH CONTRAST TECHNIQUE: Multiplanar, multiecho pulse sequences of the brain and surrounding structures were obtained without and with intravenous contrast. CONTRAST:  64mL GADAVIST GADOBUTROL 1 MMOL/ML IV SOLN COMPARISON:  PET-CT 08/23/2005. Report of brain MRI 07/30/2001 (no images available). FINDINGS: Brain: Cerebral volume is within normal limits. No restricted diffusion to suggest acute infarction. No midline shift, mass effect, evidence of mass lesion, ventriculomegaly, extra-axial collection or acute intracranial hemorrhage. Cervicomedullary junction and pituitary are within normal limits. No abnormal enhancement identified. No dural thickening. Pear[place] Cables and white matter signal is within normal limits throughout the brain. No encephalomalacia or chronic cerebral blood products identified. Vascular: Major intracranial vascular flow voids are preserved. The major dural venous sinuses are enhancing and appear to be patent. Skull and upper cervical spine: Negative visible  cervical spine and spinal cord. Visualized bone marrow signal is within normal limits. Sinuses/Orbits: Negative orbits. Small left maxillary sinus mucous retention cyst and otherwise trace paranasal sinus mucosal thickening. Both frontal sinuses are clear. No sinus fluid levels. Other: Mastoids are also well pneumatized. Visible internal auditory structures appear normal. Suggestion of generalized scalp and face subcutaneous edema. And asymmetric more confluent scalp fluid or edema along the left posterior convexity as seen on series 9, image 6. This does not appear to be rim enhancing or drainable. IMPRESSION: 1.  Normal MRI appearance of the brain. 2. Query anasarca - evidence of generalized and mildly asymmetric scalp and face soft tissue edema. 3. Minor paranasal sinus inflammation, significance doubtful. Electronically Signed   By: Genevie Ann M.D.   On: 03/03/2019 14:25     Assessment/Plan: Facial cellulitis/orbital cellulitis = appears consistent with streptococcal type of infection. Will change abtx to cefazolin plus 2 days of clindamycin for anti-toxin effect. Due to significant swelling- recommend pred 40mg  x 2 to see if some improvement  Vesicular rash to base of neck = has features of shingles but partially dry. Due to onset about 7 days. No need for antivirals at this time  If improved tomorrow, will give an oral regimen to complete course.  [facility] for Infectious Diseases Cell: [phone] Pager: [phone]  03/04/2019, 5:09 PM

## 2019-03-04 NOTE — Progress Notes (Signed)
Subjective: HD#3 Overnight, no acute events reported.  This morning, patient evaluated at bedside. Reports she is breathing okay and swallowing fine. Believes her forehead edema is improving. Denies any itching or burning of her rash. Denies any previous history of something similar.   Objective:  Vital signs in last 24 hours: Vitals:   03/03/19 1224 03/03/19 2120 03/04/19 0500 03/04/19 0518  BP: 103/67 (!) 112/53  110/60  Pulse: 66 68  64  Resp: 16 20  18   Temp: 98.4 F (36.9 C) 98.2 F (36.8 C)  98.4 F (36.9 C)  TempSrc: Oral Oral  Oral  SpO2: 99% 98%  98%  Weight:   82.9 kg   Height:       CBC Latest Ref Rng & Units 03/04/2019 03/03/2019 03/02/2019  WBC 4.0 - 10.5 K/uL 7.7 10.2 12.2(H)  Hemoglobin 12.0 - 15.0 g/dL 11.6(L) 11.6(L) 12.8  Hematocrit 36.0 - 46.0 % 35.6(L) 35.3(L) 38.6  Platelets 150 - 400 K/uL 220 202 212   BMP Latest Ref Rng & Units 03/04/2019 03/03/2019 03/02/2019  Glucose 70 - 99 mg/dL 103(H) 98 155(H)  BUN 6 - 20 mg/dL 10 7 7   Creatinine 0.44 - 1.00 mg/dL 0.65 0.62 0.76  Sodium 135 - 145 mmol/L 141 141 132(L)  Potassium 3.5 - 5.1 mmol/L 4.2 3.9 3.4(L)  Chloride 98 - 111 mmol/L 109 108 107  CO2 22 - 32 mmol/L 25 24 20(L)  Calcium 8.9 - 10.3 mg/dL 8.3(L) 7.9(L) 7.8(L)   Physical Exam  Constitutional: She is oriented to person, place, and time and well-developed, well-nourished, and in no distress.  HENT:  Head: Normocephalic and atraumatic.  Right Ear: External ear normal.  Left Ear: External ear normal.  Eyes: Conjunctivae and EOM are normal. Right eye exhibits no discharge. Left eye exhibits no discharge. No scleral icterus.  Cardiovascular: Normal rate, regular rhythm, normal heart sounds and intact distal pulses. Exam reveals no gallop and no friction rub.  No murmur heard. Pulmonary/Chest: Effort normal and breath sounds normal. No respiratory distress. She has no wheezes. She has no rales.  Abdominal: Soft. Bowel sounds are normal. She exhibits  no distension. There is no abdominal tenderness. There is no rebound.  Musculoskeletal:        General: No tenderness or edema. Normal range of motion.     Cervical back: Normal range of motion and neck supple.  Lymphadenopathy:    She has cervical adenopathy.  Neurological: She is alert and oriented to person, place, and time.  Skin: Skin is warm and dry. Rash noted.        Assessment/Plan: This is a 49 year old female with a history of NHBCL s/p chemothearpy in 2004 who presented with fevers, generalized body aches, tender lymphadenopathy, and erythematous facial rash.   Sepsis 2/2 presumed erysipelas:  Patient admitted with rash, leukocytosis, tachycardia and fever up to 102. Noted to have sharp demarcation of rash, consistent with erysipelas; however, also noted to have significant edema of forehead, bilateral eyes and now into the maxillary region. CT soft tissue head/neck with diffuse subcutaneous edema, but no active lymphoma noted. MRI head negative for acute intracranial abscess. Blood cultures negative to date. IV antibiotics vancomycin > cefazolin > cefepime. Patient's fever curve and leukocytosis improved. She notes that she feels better this morning but notes that she has more swelling around cheeks. No history of similar episodes. This morning, patient back to cefazolin and addition of clindamycin.  Her initial presentation with sepsis with leukocytosis is  suggestive of erysipelas causing her symptoms. Given facial angioedema and history of B cell lymphoma, differential includes acquired C1 inhibitor deficiency.  - IV Cefepime 2g q6h - C1 esterase inhibitor, C1q complement, C4 complement levels  - CBC daily  - Continue IV toradol prn for pain control - Benadryl 25mg  PO q6h prn   Lymphadenopathy:  This is likely reactive in setting of sepsis 2/2 erysipelas. However, given history of non-Hodgkin B-cell lymphoma s/p chemotherapy (R-CHOP) in 2004, night sweats and decreased  appetite, she will need to follow up with oncology for repeat PET scan. Reassuring that CT soft tissue head/neck is negative for active lymphoma.  - Continue to monitor  FEN/GI: Diet: Regular Fluids: None Electrolytes: Monitor and replete prn  VTE Prophylaxis: Lovenox 40mg  daily  Code: FULL  Prior to Admission Living Arrangement: Home Anticipated Discharge Location: Home  Barriers to Discharge: Continued medical management  Dispo: Anticipated discharge in approximately 1-2 day(s).   Harvie Heck, MD  Internal Medicine, PGY-1 Pager: [phone] 03/04/2019, 7:26 AM

## 2019-03-05 LAB — BASIC METABOLIC PANEL
Anion gap: 9 (ref 5–15)
BUN: 9 mg/dL (ref 6–20)
CO2: 23 mmol/L (ref 22–32)
Calcium: 8.4 mg/dL — ABNORMAL LOW (ref 8.9–10.3)
Chloride: 106 mmol/L (ref 98–111)
Creatinine, Ser: 0.55 mg/dL (ref 0.44–1.00)
GFR calc Af Amer: >60 mL/min (ref >60)
GFR calc non Af Amer: >60 mL/min (ref >60)
Glucose, Bld: 121 mg/dL — ABNORMAL HIGH (ref 70–99)
Potassium: 4.2 mmol/L (ref 3.5–5.1)
Sodium: 138 mmol/L (ref 135–145)

## 2019-03-05 LAB — C4 COMPLEMENT: Complement C4, Body Fluid: 18 mg/dL (ref 12–38)

## 2019-03-05 LAB — CBC
HCT: 36.3 % (ref 36.0–46.0)
Hemoglobin: 12.1 g/dL (ref 12.0–15.0)
MCH: 31.4 pg (ref 26.0–34.0)
MCHC: 33.3 g/dL (ref 30.0–36.0)
MCV: 94.3 fL (ref 80.0–100.0)
Platelets: 241 10*3/uL (ref 150–400)
RBC: 3.85 MIL/uL — ABNORMAL LOW (ref 3.87–5.11)
RDW: 13 % (ref 11.5–15.5)
WBC: 7.9 10*3/uL (ref 4.0–10.5)
nRBC: 0 % (ref 0.0–0.2)

## 2019-03-05 LAB — C1 ESTERASE INHIBITOR: C1INH SerPl-mCnc: 34 mg/dL (ref 21–39)

## 2019-03-05 MED ORDER — CEPHALEXIN 500 MG PO CAPS: 500.0000 mg | ORAL_CAPSULE | Freq: Three times a day (TID) | ORAL | 0 refills | Status: AC

## 2019-03-05 MED ORDER — PREDNISONE 10 MG PO TABS: 20.0000 mg | ORAL_TABLET | Freq: Every day | ORAL | 0 refills | Status: AC

## 2019-03-05 MED ORDER — PREDNISONE 20 MG PO TABS
20.0000 mg | ORAL_TABLET | Freq: Every day | ORAL | Status: DC
  Administered 2019-03-06: 20 mg via ORAL
  Filled 2019-03-05: qty 1

## 2019-03-05 MED ORDER — CEPHALEXIN 500 MG PO CAPS
500.0000 mg | ORAL_CAPSULE | Freq: Three times a day (TID) | ORAL | Status: DC
  Administered 2019-03-05 – 2019-03-06 (×3): 500 mg via ORAL
  Filled 2019-03-05 (×3): qty 1

## 2019-03-05 MED FILL — predniSONE 10 MG TABS: 10 | 1 days supply | Qty: 2 | Fill #0

## 2019-03-05 MED FILL — CEPHALEXIN 500 MG CAPS: 500 | 5 days supply | Qty: 15 | Fill #0

## 2019-03-05 NOTE — Progress Notes (Signed)
[place] [facility] for Infectious Disease    Date of Admission:  03/01/2019   Total days of antibiotics 5           ID: Maureen Richardson is a 49 y.o. female with facial cellulitis Principal Problem:   Erysipelas Active Problems:   Cellulitis   Sepsis ([place])    Subjective: She reports a great amount of improvement overnight since adding steroids especially around her eyes.  New rash in belly button she would like to show [place]. Does not itch, no drainage or pain.    ROS:  Review of Systems  HENT:       Improved swelling to face  Eyes: Negative for photophobia and discharge.  Respiratory: Negative for shortness of breath.   Gastrointestinal: Negative for diarrhea and nausea.  Skin:       New red rash inside umbilicus. Not itchy or raised     Medications:  . cephALEXin  500 mg Oral Q8H  . enoxaparin (LOVENOX) injection  40 mg Subcutaneous Q24H  . [START ON 03/06/2019] predniSONE  20 mg Oral Q breakfast  . sodium chloride flush  10-40 mL Intracatheter Q12H    Objective: Vital signs in last 24 hours: Temp:  [97.5 F (36.4 C)-98.2 F (36.8 C)] 97.5 F (36.4 C) (02/23 0518) Pulse Rate:  [44-64] 55 (02/23 0518) Resp:  [18] 18 (02/23 0518) BP: (116-126)/(60-68) 126/60 (02/23 0518) SpO2:  [98 %] 98 % (02/23 0518)    Physical Exam  Constitutional:  oriented to person, place, and time. appears well-developed and well-nourished. No distress.  HENT: [place]/AT, PERRLA, no scleral icterus, no conjunctivitis. Swelling around orbits improved with scaling skin, not as erythematous.  Mouth/Throat: Oropharynx is clear and moist. No oropharyngeal exudate.  Skin = nape of neck has erythematous plaque with small cluster of vesicles on superior border, coalesced in the center. Scaling of skin on rash to forehead, under eyes and cheeks, no vesicles.  Neuro = EOMI no pain  LAD: preauricular LN on L > R   Lab Results Recent Labs    03/04/19 0431 03/05/19 0454  WBC 7.8  7.7 7.9  HGB 11.9*   11.6* 12.1  HCT 35.7*  35.6* 36.3  NA 141 138  K 4.2 4.2  CL 109 106  CO2 25 23  BUN 10 9  CREATININE 0.65 0.55    Microbiology: engative Studies/Results: CT SOFT TISSUE NECK W CONTRAST  Result Date: 03/03/2019 CLINICAL DATA:  49 year old female with history of treated B-cell lymphoma a decade or more ago. Recent low-grade fever, body ache. EXAM: CT NECK WITH CONTRAST TECHNIQUE: Multidetector CT imaging of the neck was performed using the standard protocol following the bolus administration of intravenous contrast. CONTRAST:  20mL OMNIPAQUE IOHEXOL 300 MG/ML  SOLN COMPARISON:  Brain MRI today reported separately. Neck CT 02/06/2003. PET-CT 08/23/2005. Cervical spine MRI 05/26/2016. FINDINGS: Pharynx and larynx: Laryngeal and pharyngeal soft tissue contours remain normal. Negative parapharyngeal and retropharyngeal spaces. Salivary glands: Negative sublingual space. Submandibular glands and parotid glands appear stable and within normal limits. Thyroid: There is a new 26 mm mostly hypodense but mildly heterogeneous thyroid nodule on the right since 2005. However, size of this nodule appears stable since the 2018 MRI. And this was characterized with thyroid ultrasound on 07/04/2016. Isthmus and left lobe appear negative. Lymph nodes: The level 1 and level 2A lymph nodes appear stable and within normal limits since 2005. There are small but rounded bilateral level 5 or level IIIb nodes, the  largest is 11 mm short axis on series 3, image 50, and these are increased in size and number since 2005. However, the largest node was 9-10 mm in 2018. No other new or increased lymph nodes in the neck. Vascular: Major vascular structures in the neck and at the skull base are patent. The left IJ appears dominant as before. Partially retropharyngeal course of both carotid bifurcations. Limited intracranial: Stable, negative. Visualized orbits: Negative. Mastoids and visualized paranasal sinuses: Left maxillary  sinus mucous retention cyst and trace paranasal sinus mucosal thickening. Tympanic cavities and mastoids are clear. Skeleton: Prior cervical ACDF. Associated solid arthrodesis. Reversal of cervical lordosis. No acute or suspicious osseous lesion identified. Upper chest: Respiratory motion. No upper lung nodule. No definite superior mediastinal lymphadenopathy. No axillary lymphadenopathy. Other: Generalized face and scalp subcutaneous stranding as seen on series 3, image 27. IMPRESSION: 1. Small but conspicuous bilateral level 5/3B lymph nodes are increased from a 2005 neck CT, but are fairly stable spine from a 2018 cervical spine MRI - arguing against active lymphoma. Other lymph nodes are within normal limits. 2. Nonspecific generalized scalp and face subcutaneous edema. 3. Prior cervical spine ACDF. Electronically Signed   By: Genevie Ann M.D.   On: 03/03/2019 17:35   MR BRAIN W WO CONTRAST  Result Date: 03/03/2019 CLINICAL DATA:  49 year old female with history of treated B-cell lymphoma a decade or more ago. Recent low-grade fever, recently negative for COVID-19 and flu. Generalized body aches. Forehead pain. EXAM: MRI HEAD WITHOUT AND WITH CONTRAST TECHNIQUE: Multiplanar, multiecho pulse sequences of the brain and surrounding structures were obtained without and with intravenous contrast. CONTRAST:  46mL GADAVIST GADOBUTROL 1 MMOL/ML IV SOLN COMPARISON:  PET-CT 08/23/2005. Report of brain MRI 07/30/2001 (no images available). FINDINGS: Brain: Cerebral volume is within normal limits. No restricted diffusion to suggest acute infarction. No midline shift, mass effect, evidence of mass lesion, ventriculomegaly, extra-axial collection or acute intracranial hemorrhage. Cervicomedullary junction and pituitary are within normal limits. No abnormal enhancement identified. No dural thickening. Pearline Cables and white matter signal is within normal limits throughout the brain. No encephalomalacia or chronic cerebral blood  products identified. Vascular: Major intracranial vascular flow voids are preserved. The major dural venous sinuses are enhancing and appear to be patent. Skull and upper cervical spine: Negative visible cervical spine and spinal cord. Visualized bone marrow signal is within normal limits. Sinuses/Orbits: Negative orbits. Small left maxillary sinus mucous retention cyst and otherwise trace paranasal sinus mucosal thickening. Both frontal sinuses are clear. No sinus fluid levels. Other: Mastoids are also well pneumatized. Visible internal auditory structures appear normal. Suggestion of generalized scalp and face subcutaneous edema. And asymmetric more confluent scalp fluid or edema along the left posterior convexity as seen on series 9, image 6. This does not appear to be rim enhancing or drainable. IMPRESSION: 1.  Normal MRI appearance of the brain. 2. Query anasarca - evidence of generalized and mildly asymmetric scalp and face soft tissue edema. 3. Minor paranasal sinus inflammation, significance doubtful. Electronically Signed   By: Genevie Ann M.D.   On: 03/03/2019 14:25     Assessment/Plan: Facial cellulitis/orbital cellulitis = appears consistent with streptococcal type of infection. Improved with addition of clindamycin and prednisone - stop clindamycin after today to complete 48h. Would do 2 more days of prednisone 20mg  then stop.  Plan to transition to oral Cephalexin 500 mg TID to finish out total 10 days of treatment.   Umbilical Rash = unclear if this is linked but  may be c/w yeast. Would keep area dry and clean.   Vesicular rash to base of neck = has features of shingles but also could consider psoriasis - if persists outpatient dermatology follow up may be helpful for her.    Janene Madeira, MSN, NP-C [facility] for Infectious Disease [email or web address] Pager: [phone] Office: [phone] [place]: [phone]

## 2019-03-05 NOTE — Progress Notes (Signed)
Subjective: HD#4 Overnight, no acute events reported.  This morning, patient evaluated at bedside. Reports she is excited she is doing much better today. She reports a history of psoriasis which mostly she notices on her scalp . She has a area on her umbilicus which she would like [place] to evaluate. No difficulty breathing.   Objective:  Vital signs in last 24 hours: Vitals:   03/04/19 0518 03/04/19 1441 03/04/19 2141 03/05/19 0518  BP: 110/60 116/60 119/68 126/60  Pulse: 64 64 (!) 44 (!) 55  Resp: 18 18 18 18   Temp: 98.4 F (36.9 C) 98.2 F (36.8 C) 97.7 F (36.5 C) (!) 97.5 F (36.4 C)  TempSrc: Oral Oral Oral Oral  SpO2: 98% 98% 98% 98%  Weight:      Height:       CBC Latest Ref Rng & Units 03/05/2019 03/04/2019 03/04/2019  WBC 4.0 - 10.5 K/uL 7.9 7.8 7.7  Hemoglobin 12.0 - 15.0 g/dL 12.1 11.9(L) 11.6(L)  Hematocrit 36.0 - 46.0 % 36.3 35.7(L) 35.6(L)  Platelets 150 - 400 K/uL 241 225 220   BMP Latest Ref Rng & Units 03/05/2019 03/04/2019 03/03/2019  Glucose 70 - 99 mg/dL 121(H) 103(H) 98  BUN 6 - 20 mg/dL 9 10 7   Creatinine 0.44 - 1.00 mg/dL 0.55 0.65 0.62  Sodium 135 - 145 mmol/L 138 141 141  Potassium 3.5 - 5.1 mmol/L 4.2 4.2 3.9  Chloride 98 - 111 mmol/L 106 109 108  CO2 22 - 32 mmol/L 23 25 24   Calcium 8.9 - 10.3 mg/dL 8.4(L) 8.3(L) 7.9(L)   Physical Exam  Constitutional: She is oriented to person, place, and time and well-developed, well-nourished, and in no distress.  HENT:  Head: Normocephalic and atraumatic.  Right Ear: External ear normal.  Left Ear: External ear normal.  Eyes: Conjunctivae and EOM are normal. Right eye exhibits no discharge. Left eye exhibits no discharge. No scleral icterus.  Cardiovascular: Normal rate, regular rhythm, normal heart sounds and intact distal pulses. Exam reveals no gallop and no friction rub.  No murmur heard. Pulmonary/Chest: Effort normal and breath sounds normal. No respiratory distress. She has no wheezes. She has no rales.   Abdominal: Soft. Bowel sounds are normal. She exhibits no distension. There is no abdominal tenderness. There is no rebound.  Musculoskeletal:        General: No tenderness or edema. Normal range of motion.     Cervical back: Normal range of motion and neck supple.  Lymphadenopathy:    She has cervical adenopathy.  Neurological: She is alert and oriented to person, place, and time.  Skin: Skin is warm and dry. Rash noted.  Facial and posterior neck rash/edema as seen below Umbilicus: erythema noted in and around umbilicus, non-pruritic and without scaling. No papules or vesicles noted at this time          Assessment/Plan: This is a 49 year old female with a history of NHBCL s/p chemothearpy in 2004 who presented with fevers, generalized body aches, tender lymphadenopathy, and erythematous facial rash.   Sepsis 2/2 presumed erysipelas:  Patient admitted with rash, leukocytosis, tachycardia and fever up to 102. Noted to have sharp demarcation of rash, consistent with erysipelas and significant edema of forehead extending to maxillary region. CT soft tissue head/neck with diffuse subcutaneous edema, but no active lymphoma noted. MRI head negative for acute intracranial abscess. Blood cultures negative to date. IV antibiotics vancomycin > cefazolin > cefepime> cefazolin + clindamycin.  Patient's fever curve and leukocytosis  improved. She notes that she feels much better this morning. Her edema has significantly improved. C4 complement level wnl.  - IV cefazolin 2g q8h + clindamycin 600mg  q8h day 2 - Prednisone 40mg  day 2 - F/u C1 esterase inhibitor, C1q complement - CBC daily  - Continue IV toradol prn for pain control - Benadryl 25mg  PO q6h prn   Lymphadenopathy:  This is likely reactive in setting of sepsis 2/2 erysipelas. However, given history of non-Hodgkin B-cell lymphoma s/p chemotherapy (R-CHOP) in 2004, night sweats and decreased appetite, she will need to follow up with  oncology for repeat PET scan. Reassuring that CT soft tissue head/neck is negative for active lymphoma.  - Continue to monitor  FEN/GI: Diet: Regular Fluids: None Electrolytes: Monitor and replete prn  VTE Prophylaxis: Lovenox 40mg  daily  Code: FULL  Prior to Admission Living Arrangement: Home Anticipated Discharge Location: Home  Barriers to Discharge: Continued medical management  Dispo: Anticipated discharge in approximately 1-2 day(s).   Harvie Heck, MD  Internal Medicine, PGY-1 Pager: [phone] 03/05/2019, 6:37 AM

## 2019-03-06 ENCOUNTER — Encounter (HOSPITAL_COMMUNITY): Payer: Self-pay | Admitting: Internal Medicine

## 2019-03-06 LAB — BASIC METABOLIC PANEL
Anion gap: 9 (ref 5–15)
BUN: 12 mg/dL (ref 6–20)
CO2: 24 mmol/L (ref 22–32)
Calcium: 8.7 mg/dL — ABNORMAL LOW (ref 8.9–10.3)
Chloride: 107 mmol/L (ref 98–111)
Creatinine, Ser: 0.69 mg/dL (ref 0.44–1.00)
GFR calc Af Amer: >60 mL/min (ref >60)
GFR calc non Af Amer: >60 mL/min (ref >60)
Glucose, Bld: 126 mg/dL — ABNORMAL HIGH (ref 70–99)
Potassium: 4.1 mmol/L (ref 3.5–5.1)
Sodium: 140 mmol/L (ref 135–145)

## 2019-03-06 LAB — CULTURE, BLOOD (ROUTINE X 2)
Culture: NO GROWTH
Culture: NO GROWTH
Special Requests: ADEQUATE

## 2019-03-06 LAB — CBC
HCT: 34.8 % — ABNORMAL LOW (ref 36.0–46.0)
Hemoglobin: 11.7 g/dL — ABNORMAL LOW (ref 12.0–15.0)
MCH: 31.2 pg (ref 26.0–34.0)
MCHC: 33.6 g/dL (ref 30.0–36.0)
MCV: 92.8 fL (ref 80.0–100.0)
Platelets: 294 10*3/uL (ref 150–400)
RBC: 3.75 MIL/uL — ABNORMAL LOW (ref 3.87–5.11)
RDW: 12.9 % (ref 11.5–15.5)
WBC: 12.2 10*3/uL — ABNORMAL HIGH (ref 4.0–10.5)
nRBC: 0 % (ref 0.0–0.2)

## 2019-03-06 NOTE — Plan of Care (Signed)

## 2019-03-06 NOTE — Discharge Instructions (Signed)
Ms. Maureen, Richardson were admitted to the hospital with a forehead rash and fever consistent with Erysipelas. You were treated with IV antibiotics and steroids with improvement in your symptoms. On discharge, please continue to take the antibiotics as prescribed (cephalexin 500mg  three times daily for 5 days) and steroids (prednisone 20mg  once tomorrow).  Please schedule to follow up with your primary care physician in 1-2 weeks. You may benefit from a rheumatology or dermatology referral for your psoriasis.   Your imaging during this admission, including CT of head and neck showed lymph nodes that were stable compared to your MRI in 2018 without signs of active lymphoma. However, given your history of Non-Hodgkin lymphoma and symptoms of night sweats and hot flashes, you would benefit from following up with your oncologist and OB/GYN.   Thank you!

## 2019-03-06 NOTE — Progress Notes (Signed)
AVS given and reviewed with pt. Medications delivered to bedside by transitions of care pharmacy and discussed with pt. All questions answered to satisfaction. Pt verbalized understanding of information given. Pt to be escorted off the unit with all belongings via wheelchair by volunteer services. 

## 2019-03-06 NOTE — Care Management (Signed)
Received a consult for PCP, went to room and patient has left.  No answer on cell phone.   Magdalen Spatz RN

## 2019-03-06 NOTE — Progress Notes (Addendum)
Subjective: HD#5 Overnight, no acute events reported.  This morning, patient evaluated at bedside. Reports continued improvement and she notes the rash around her eyes are crusted. She continues to have a headache, but the Toradol helped.   Objective:  Vital signs in last 24 hours: Vitals:   03/05/19 0518 03/05/19 1322 03/05/19 2128 03/06/19 0457  BP: 126/60 119/63 122/70 124/63  Pulse: (!) 55 60 79 (!) 51  Resp: 18 18 18 18   Temp: (!) 97.5 F (36.4 C) 98.3 F (36.8 C) 98 F (36.7 C) 98.1 F (36.7 C)  TempSrc: Oral Oral Oral Oral  SpO2: 98% 97% 97% 97%  Weight:    83.5 kg  Height:       CBC Latest Ref Rng & Units 03/06/2019 03/05/2019 03/04/2019  WBC 4.0 - 10.5 K/uL 12.2(H) 7.9 7.8  Hemoglobin 12.0 - 15.0 g/dL 11.7(L) 12.1 11.9(L)  Hematocrit 36.0 - 46.0 % 34.8(L) 36.3 35.7(L)  Platelets 150 - 400 K/uL 294 241 225   BMP Latest Ref Rng & Units 03/06/2019 03/05/2019 03/04/2019  Glucose 70 - 99 mg/dL 126(H) 121(H) 103(H)  BUN 6 - 20 mg/dL 12 9 10   Creatinine 0.44 - 1.00 mg/dL 0.69 0.55 0.65  Sodium 135 - 145 mmol/L 140 138 141  Potassium 3.5 - 5.1 mmol/L 4.1 4.2 4.2  Chloride 98 - 111 mmol/L 107 106 109  CO2 22 - 32 mmol/L 24 23 25   Calcium 8.9 - 10.3 mg/dL 8.7(L) 8.4(L) 8.3(L)   Physical Exam  Constitutional: She is oriented to person, place, and time and well-developed, well-nourished, and in no distress.  HENT:  Head: Normocephalic and atraumatic.  Right Ear: External ear normal.  Left Ear: External ear normal.  Eyes: Conjunctivae and EOM are normal. Right eye exhibits no discharge. Left eye exhibits no discharge. No scleral icterus.  Cardiovascular: Normal rate, regular rhythm, normal heart sounds and intact distal pulses. Exam reveals no gallop and no friction rub.  No murmur heard. Pulmonary/Chest: Effort normal and breath sounds normal. No respiratory distress. She has no wheezes. She has no rales.  Abdominal: Soft. Bowel sounds are normal. She exhibits no  distension. There is no abdominal tenderness. There is no rebound.  Musculoskeletal:        General: No tenderness or edema. Normal range of motion.     Cervical back: Normal range of motion and neck supple.  Neurological: She is alert and oriented to person, place, and time.  Skin: Skin is warm and dry. Rash noted.  Facial and posterior neck rash/edema as seen below Umbilicus: erythema noted in and around umbilicus, non-pruritic and without scaling. No papules or vesicles noted at this time         Assessment/Plan: This is a 49 year old female with a history of NHBCL s/p chemothearpy in 2004 who presented with fevers, generalized body aches, tender lymphadenopathy, and erythematous facial rash.   Sepsis 2/2 presumed erysipelas:  Patient admitted with rash, leukocytosis, tachycardia and fever up to 102. Noted to have sharp demarcation of rash, consistent with erysipelas and significant edema of forehead extending to maxillary region. CT soft tissue head/neck with diffuse subcutaneous edema, but no active lymphoma noted. MRI head negative for acute intracranial abscess. Blood cultures negative to date. IV antibiotics vancomycin > cefazolin > cefepime> cefazolin + clindamycin.  Patient's fever curve and leukocytosis improved. Patient's symptoms greatly improved. She is transitioned to oral regimen to complete 10 day course of antibiotics. She is stable for discharge today with oral antibiotics,  steroids to be completed tomorrow and follow up with PCP.  - Cephalexin 500mg  q8h x 5 days - Prednisone 20mg  day 1 of 2 - Acetaminophen 650mg  q6h prn  Lymphadenopathy:  This is likely reactive in setting of sepsis 2/2 erysipelas. However, given history of non-Hodgkin B-cell lymphoma s/p chemotherapy (R-CHOP) in 2004, night sweats and decreased appetite, she will need to follow up with oncology for repeat PET scan. Reassuring that CT soft tissue head/neck is negative for active lymphoma.  - Follow up  with PCP and oncologist for further evaluation - No further work up at this time   FEN/GI: Diet: Regular Fluids: None Electrolytes: Monitor and replete prn  VTE Prophylaxis: Lovenox 40mg  daily  Code: FULL  Prior to Admission Living Arrangement: Home Anticipated Discharge Location: Home  Barriers to Discharge: None Dispo: Anticipated discharge today.   Harvie Heck, MD  Internal Medicine, PGY-1 Pager: [phone] 03/06/2019, 7:01 AM

## 2019-03-07 DIAGNOSIS — L03211 Cellulitis of face: Secondary | ICD-10-CM

## 2019-03-07 DIAGNOSIS — L409 Psoriasis, unspecified: Secondary | ICD-10-CM

## 2019-03-07 NOTE — Discharge Summary (Signed)
Name: Maureen Richardson MRN: [id number] DOB: August 25, 1970 49 y.o. PCP: Dewayne Shorter, PA-C  Date of Admission: 03/01/2019 10:48 AM Date of Discharge: 03/06/2019 Attending Physician: Dr. Aldine Contes  Discharge Diagnosis: 1. Sepsis 2/2 erysipelas 2. Lymphadenopathy 3. Hx of non-Hodgkin B cell lymphoma  Discharge Medications: Allergies as of 03/06/2019   No Known Allergies     Medication List    TAKE these medications   cephALEXin 500 MG capsule Commonly known as: KEFLEX Take 1 capsule (500 mg total) by mouth 3 (three) times daily for 5 days.   ibuprofen 200 MG tablet Commonly known as: ADVIL Take 400 mg by mouth every 8 (eight) hours as needed for moderate pain.   predniSONE 10 MG tablet Commonly known as: DELTASONE Take 2 tablets (20 mg total) by mouth daily with breakfast for 1 dose.   THERAFLU FLU/COLD PO Take 2 tablets by mouth every 6 (six) hours as needed (cold symptoms).   THERAFLU NIGHTTIME MAX ST PO Take 2 tablets by mouth every 6 (six) hours as needed (cold symptoms).       Disposition and follow-up:   Maureen Richardson was discharged from [facility] in Stable condition.  At the hospital follow up visit please address:  1.  Sepsis 2/2 erysipelas: Admitted with rash, leukocytosis, tachycardia and fever up to 102. Sharp demarcations noted with edema. Improved with antibiotics and steroids. Patient discharged with cephalexin 500mg  q8h x5days and prednisone 20mg  (one dose).   Lymphadenopathy: Reactive in setting of sepsis 2/2 erysipelas. Hx of NHBCL s/p chemotherapy in 2004. Last oncology follow up in 2018. Ongoing night sweats and decreased appetite. Needs to f/u with oncology for repeat PET scan.   2.  Labs / imaging needed at time of follow-up: CBC  3.  Pending labs/ test needing follow-up: Complement component c1q  Follow-up Appointments: Follow-up Information    Couillard, Anderson Malta, PA-C. Schedule an appointment as soon as  possible for a visit in 1 week(s).   Specialty: Physician Assistant Contact information: [address] [place] [address] [facility] Course by problem list: 1. Sepsis 2/2 erysipelas: Patient admitted with rash, leukocytosis, tachycardia and fever up to 102. Noted to have sharp demarcation of rash, consistent with erysipelas and significant edema of forehead extending to maxillary region. CT soft tissue head/neck with diffuse subcutaneous edema. MRI head negative for acute intracranial abscess. Blood cultures remained negative. Patient started on IV antibiotics (vancomycin > cefazolin > cefepime> cefazolin + clindamycin) with improvement in fever curve and leukocytosis. Her symptoms greatly improved and was transitioned to oral regimen of cephalexin to complete 10 day course of antibiotics. She also received steroids to decrease inflammation and was discharged with prednisone 20mg  for one dose.  In setting of angioedema and given patient's age, presentation and history of non-Hodgkin B cell lymphoma, considered acquired C1 esterase deficiency for which work up negative.   2. Tender lymphadenopathy: Likely reactive in setting of sepsis 2/2 erysipelas. However, patient has history of non-Hodgkin B-cell lymphoma s/p chemotherapy (R-CHOP) in 2004. She has ongoing night sweats and decreased appetite. Last saw oncologist in 2018 and was recommended for PET scan. CT of soft tissue head/neck negative for active lymphoma. However, patient needs to follow up with oncologist for PET scan and OB/GYN.   Discharge Vitals:   BP 124/63 (BP Location: Right Arm)   Pulse (!) 51   Temp 98.1 F (36.7 C) (Oral)   Resp 18  Ht 5\' 6"  (1.676 m)   Wt 83.5 kg   LMP  (LMP Unknown)   SpO2 97%   BMI 29.71 kg/m   Pertinent Labs, Studies, and Procedures:  CBC Latest Ref Rng & Units 03/06/2019 03/05/2019 03/04/2019  WBC 4.0 - 10.5 K/uL 12.2(H) 7.9 7.8  Hemoglobin 12.0 - 15.0 g/dL 11.7(L)  12.1 11.9(L)  Hematocrit 36.0 - 46.0 % 34.8(L) 36.3 35.7(L)  Platelets 150 - 400 K/uL 294 241 225   CMP Latest Ref Rng & Units 03/06/2019 03/05/2019 03/04/2019  Glucose 70 - 99 mg/dL 126(H) 121(H) 103(H)  BUN 6 - 20 mg/dL 12 9 10   Creatinine 0.44 - 1.00 mg/dL 0.69 0.55 0.65  Sodium 135 - 145 mmol/L 140 138 141  Potassium 3.5 - 5.1 mmol/L 4.1 4.2 4.2  Chloride 98 - 111 mmol/L 107 106 109  CO2 22 - 32 mmol/L 24 23 25   Calcium 8.9 - 10.3 mg/dL 8.7(L) 8.4(L) 8.3(L)  Total Protein 6.5 - 8.1 g/dL - - -  Total Bilirubin 0.3 - 1.2 mg/dL - - -  Alkaline Phos 38 - 126 U/L - - -  AST 15 - 41 U/L - - -  ALT 0 - 44 U/L - - -   Lab Results  Component Value Date   SDES BLOOD LEFT HAND 03/01/2019   SDES BLOOD RIGHT HAND 03/01/2019   SPECREQUEST  03/01/2019    BOTTLES DRAWN AEROBIC AND ANAEROBIC Blood Culture adequate volume   SPECREQUEST  03/01/2019    BOTTLES DRAWN AEROBIC AND ANAEROBIC Blood Culture results may not be optimal due to an inadequate volume of blood received in culture bottles   CULT  03/01/2019    NO GROWTH 5 DAYS Performed at [facility] [address] [place], [place] [address]    CULT  03/01/2019    NO GROWTH 5 DAYS Performed at [facility] [address] [place], [place] [address]    Lab Results  Component Value Date   LABCOMP 18 03/04/2019   Lab Results  Component Value Date   C1INHIB 34 03/04/2019  ] CXR 03/01/2019: IMPRESSION: No active disease.  MR BRAIN W WO CONTRAST 03/03/2019: IMPRESSION: 1.  Normal MRI appearance of the brain. 2. Query anasarca - evidence of generalized and mildly asymmetric scalp and face soft tissue edema. 3. Minor paranasal sinus inflammation, significance doubtful.  CT SOFT TISSUE NECK W CONTRAST 03/03/2019: IMPRESSION: 1. Small but conspicuous bilateral level 5/3B lymph nodes are increased from a 2005 neck CT, but are fairly stable spine from a 2018 cervical spine MRI - arguing against active lymphoma.  Other lymph nodes are within normal limits. 2. Nonspecific generalized scalp and face subcutaneous edema. 3. Prior cervical spine ACDF.  Discharge Instructions: Discharge Instructions    Call MD for:  difficulty breathing, headache or visual disturbances   Complete by: As directed    Call MD for:  extreme fatigue   Complete by: As directed    Call MD for:  hives   Complete by: As directed    Call MD for:  persistant dizziness or light-headedness   Complete by: As directed    Call MD for:  persistant nausea and vomiting   Complete by: As directed    Call MD for:  redness, tenderness, or signs of infection (pain, swelling, redness, odor or green/yellow discharge around incision site)   Complete by: As directed    Call MD for:  severe uncontrolled pain   Complete by: As directed  Call MD for:  temperature >100.4   Complete by: As directed    Diet - low sodium heart healthy   Complete by: As directed    Increase activity slowly   Complete by: As directed     Maureen Richardson,  You were admitted to the hospital with a forehead rash and fever consistent with Erysipelas. You were treated with IV antibiotics and steroids with improvement in your symptoms. On discharge, please continue to take the antibiotics as prescribed (cephalexin 500mg  three times daily for 5 days) and steroids (prednisone 20mg  once tomorrow).  Please schedule to follow up with your primary care physician in 1-2 weeks. You may benefit from a rheumatology or dermatology referral for your psoriasis.   Your imaging during this admission, including CT of head and neck showed lymph nodes that were stable compared to your MRI in 2018 without signs of active lymphoma. However, given your history of Non-Hodgkin lymphoma and symptoms of night sweats and hot flashes, you would benefit from following up with your oncologist and OB/GYN.   Thank you!   SignedHarvie Heck, MD 03/07/2019, 4:30 PM   Pager: [phone]

## 2019-03-12 LAB — COMPLEMENT COMPONENT C1Q: C1q Complement Protein CC1Q: 18.9 mg/dL (ref 10.3–20.5)

## 2019-07-10 ENCOUNTER — Other Ambulatory Visit: Payer: Self-pay | Admitting: Physician Assistant

## 2019-07-10 DIAGNOSIS — Z1231 Encounter for screening mammogram for malignant neoplasm of breast: Secondary | ICD-10-CM

## 2019-07-19 ENCOUNTER — Ambulatory Visit: Payer: BC Managed Care – PPO

## 2019-07-31 ENCOUNTER — Ambulatory Visit: Payer: BC Managed Care – PPO

## 2020-04-29 ENCOUNTER — Telehealth: Payer: Self-pay | Admitting: Hematology

## 2020-04-29 NOTE — Telephone Encounter (Signed)
Received a new pt referral from Dr. Tonita Cong for [place]. Maureen Richardson has been cld and scheduled to see Dr. Irene Limbo on 4/28 at 1pm. Pt aware to arrive 20 minutes early.

## 2020-05-06 NOTE — Progress Notes (Incomplete)
Maureen Richardson    HEMATOLOGY/ONCOLOGY CONSULTATION NOTE  Date of Service: 05/06/2020  Patient Care Team: Dewayne Shorter, PA-C as PCP - General (Physician Assistant) Orthopedics - Dr Georgana Curio MD  CHIEF COMPLAINTS/PURPOSE OF CONSULTATION:  H/o stage IIA non-Hodgkin's lymphoma (DLBCL)  HISTORY OF PRESENTING ILLNESS:   Maureen Richardson is a wonderful 50 y.o. female who has been referred to [place] by Dr .Dewayne Shorter, PA-C  for evaluation and management of k/h/o Diffuse large B cell lymphoma.   As per available records the patient has a history of stage II EA non-Hodgkin's lymphoma/diffuse large B-cell lymphoma diagnosed in 2003 when she presented with right upper extremity weakness and numbness and paresthesias. She had her husband report that the diagnosis was somewhat delayed given the atypical presentation. Patient will eventually diagnosed with diffuse large B-cell lymphoma and was treated with 6 cycles of R CHOP which she completed in October 2004. She did not require any radiation therapy. She notes that she has continued to have right upper extremity weakness and some tingling that did not completely resolve due to her right brachial plexopathy.  She has also had cervical spine degenerative disc disease and has apparently required surgery with spinal fusion at C6-7.  Patient notes she recently had a fall and developed left lower extremity discomfort and weakness and was referred to orthopedics who noted that she has left L5 radical of the related to L5 foraminal stenosis from her fall and disc disease.  She also notes some increased neck discomfort and right upper extremity tingling and numbness and some cramping/muscle twitching. She was evaluated for these symptoms by her orthopedic Dr Tonita Cong with an MRI of the cervical spine done on 05/16/2016 which showed disc bulge and left paracentral protrusion at C5-6 resulting in deformity of the cord worse on the left. Spondylosis has progressed  since the last MRI. No notable change in the central disc protrusion at C4-5 which contacts and mildly deforms the ventral cord. Status post C6-7 discectomy and fusion.  2.6 cm T2 hyperintense lesion in the right thyroid nodule which is chronic but has increased in size since her last MRI in 2011.  Patient reports that she is having some night sweats and wonders if this is related to possible lymphoma or her perimenopausal symptoms. Has been gaining weight with no acute weight loss. Notes some fatigue. No overt fevers or chills. No overt palpable masses or skin changes. Husband notes that her face feels somewhat puffier and that she has snoring some.  No other acute new focal symptoms.  INTERVAL HISTORY Maureen Richardson is a wonderful 50 y.o. female who is here today again for  evaluation and management of k/h/o Diffuse large B cell lymphoma. The pt was last seen by [place] in 2018. The pt reports that she is doing well overall.  The pt reports ***  Lab results *** of CBC w/diff and CMP is as follows: all values are WNL except for ***.   On review of systems, pt reports *** and denies *** and any other symptoms.   MEDICAL HISTORY:   #1 history of stage IIA non-Hodgkin's lymphoma diagnosed in 2003. Non-Hodgkin lymphoma involving the brachial plexus on the right side and caused right upper extremity neurological deficits. Treated with 6 cycles of R CHOP and completed treatment in October 2004. No RT. Previously was followed by Dr.Khan/Dr Alen Blew.  #2 active smoker  #3 history of cervical degenerative disc disease status post cervical spine surgery with fusion at C 6-7  #  4 she reports having had a heart lesion that was operated on a long time ago.  #5 left L5 reticulocyte Lopp as they secondary to disc herniation and foraminal stenosis at L5  SURGICAL HISTORY:  1) LN biopsy 2) C 6-7 spinal fusion 3) ?surgery for tumor on rt side of heart  SOCIAL HISTORY: Social History    Socioeconomic History  . Marital status: Married    Spouse name: Not on file  . Number of children: Not on file  . Years of education: Not on file  . Highest education level: Not on file  Occupational History  . Not on file  Tobacco Use  . Smoking status: Former Smoker    Types: Cigarettes  . Smokeless tobacco: Never Used  Vaping Use  . Vaping Use: Never used  Substance and Sexual Activity  . Alcohol use: Never  . Drug use: Never  . Sexual activity: Not on file  Other Topics Concern  . Not on file  Social History Narrative  . Not on file   Social Determinants of Health   Financial Resource Strain: Not on file  Food Insecurity: Not on file  Transportation Needs: Not on file  Physical Activity: Not on file  Stress: Not on file  Social Connections: Not on file  Intimate Partner Violence: Not on file  Active smoker one pack per day for more than 30 years Occasional alcohol use Works with the [employer].   FAMILY HISTORY: No reported family history of lymphoma or other blood disorders or cancers.  ALLERGIES:  has No Known Allergies.  MEDICATIONS:  ***  REVIEW OF SYSTEMS:   10 Point review of Systems was done is negative except as noted above.  PHYSICAL EXAMINATION: ECOG PERFORMANCE STATUS: 1 - Symptomatic but completely ambulatory  . There were no vitals filed for this visit. There were no vitals filed for this visit. .There is no height or weight on file to calculate BMI.  *** GENERAL:alert, in no acute distress and comfortable SKIN: no acute rashes, no significant lesions EYES: conjunctiva are pink and non-injected, sclera anicteric OROPHARYNX: MMM, no exudates, no oropharyngeal erythema or ulceration NECK: supple, no JVD LYMPH:  no palpable lymphadenopathy in the cervical, axillary or inguinal regions LUNGS: clear to auscultation b/l with normal respiratory effort HEART: regular rate & rhythm ABDOMEN:  normoactive bowel sounds , non  tender, not distended. Extremity: no pedal edema PSYCH: alert & oriented x 3 with fluent speech NEURO: Some decreased in grip strength in her right upper extremity.  LABORATORY DATA:  I have reviewed the data as listed  . CBC Latest Ref Rng & Units 03/06/2019 03/05/2019 03/04/2019  WBC 4.0 - 10.5 K/uL 12.2(H) 7.9 7.8  Hemoglobin 12.0 - 15.0 g/dL 11.7(L) 12.1 11.9(L)  Hematocrit 36.0 - 46.0 % 34.8(L) 36.3 35.7(L)  Platelets 150 - 400 K/uL 294 241 225    . CMP Latest Ref Rng & Units 03/06/2019 03/05/2019 03/04/2019  Glucose 70 - 99 mg/dL 126(H) 121(H) 103(H)  BUN 6 - 20 mg/dL 12 9 10   Creatinine 0.44 - 1.00 mg/dL 0.69 0.55 0.65  Sodium 135 - 145 mmol/L 140 138 141  Potassium 3.5 - 5.1 mmol/L 4.1 4.2 4.2  Chloride 98 - 111 mmol/L 107 106 109  CO2 22 - 32 mmol/L 24 23 25   Calcium 8.9 - 10.3 mg/dL 8.7(L) 8.4(L) 8.3(L)  Total Protein 6.5 - 8.1 g/dL - - -  Total Bilirubin 0.3 - 1.2 mg/dL - - -  Alkaline Phos  38 - 126 U/L - - -  AST 15 - 41 U/L - - -  ALT 0 - 44 U/L - - -   . Lab Results  Component Value Date   LDH 147 05/30/2016   Component     Latest Ref Rng & Units 05/30/2016  T3 Uptake Ratio     24 - 39 % 28  Free Thyroxine Index     1.2 - 4.9 2.0  Thyroxine (T4)     4.5 - 12.0 ug/dL 7.3  T4,Free(Direct)     0.82 - 1.77 ng/dL 1.31  TSH     0.308 - 3.960 m(IU)/L 1.195     RADIOGRAPHIC STUDIES: I have personally reviewed the radiological images as listed and agreed with the findings in the report. No results found.  ASSESSMENT & PLAN:   50 year old female with   #1 Previous history of stage IIa diffuse large B-cell lymphoma affecting the right brachial plexus causing a right brachial plexopathy diagnosed in 2003 and treated with 6 cycles of R CHOP which were completed in October 2004. She had persistent symptoms related to injury that had not resolved. Also has cervical spine degenerative disc disease that are adding to her symptoms. CBC is within normal limits.  Platelets are clumped but appear adequate. LDH level is within normal limits. No other overt palpable lymphadenopathy peripherally to suggest diffuse large B-cell lymphoma recurrence.  #2 night sweats with subjective fatigue.  PLAN: -***  -Will see back ***  #3 enlarging 2.6 cm thyroid nodule. Thyroid function tests appear within normal limits. Plan -Would recommend primary care physician give the patient an endocrinology for follow-up for further evaluation and management of her thyroid nodule. -PET/CT scan will probably determine if this is FDG avid.  FOLLOW UP: ***   All of the patients questions were answered with apparent satisfaction. The patient knows to call the clinic with any problems, questions or concerns.  I spent *** minutes counseling the patient face to face. The total time spent in the appointment was *** minutes and more than 50% was on counseling and direct patient cares.    Sullivan Lone MD [place] AAHIVMS [facility] Hematology/Oncology Physician [facility]  (Office):       [phone] (Work cell):  [phone] (Fax):           [phone]  05/06/2020 9:01 PM  I, Reinaldo Raddle, am acting as scribe for Dr. Sullivan Lone, MD.

## 2020-05-07 ENCOUNTER — Inpatient Hospital Stay: Payer: BC Managed Care – PPO | Admitting: Hematology

## 2020-05-07 ENCOUNTER — Telehealth: Payer: Self-pay | Admitting: Hematology

## 2020-05-07 NOTE — Telephone Encounter (Signed)
Received a vm from Ms. Stradley to reschedule her new pt appt w/Dr. Irene Limbo for today. I cld and lft the pt a vm to r/s

## 2020-05-08 ENCOUNTER — Other Ambulatory Visit: Payer: Self-pay | Admitting: Surgery

## 2020-05-08 DIAGNOSIS — E041 Nontoxic single thyroid nodule: Secondary | ICD-10-CM

## 2020-05-18 ENCOUNTER — Other Ambulatory Visit: Payer: BC Managed Care – PPO

## 2020-05-19 ENCOUNTER — Ambulatory Visit
Admission: RE | Admit: 2020-05-19 | Discharge: 2020-05-19 | Disposition: A | Discharge status: Home / Self Care | Payer: BC Managed Care – PPO | Source: Ambulatory Visit | Attending: Physician Assistant | Admitting: Physician Assistant

## 2020-05-19 ENCOUNTER — Other Ambulatory Visit: Payer: BC Managed Care – PPO

## 2020-05-19 ENCOUNTER — Ambulatory Visit: Payer: BC Managed Care – PPO

## 2020-05-19 ENCOUNTER — Other Ambulatory Visit: Payer: Self-pay

## 2020-05-19 DIAGNOSIS — Z1231 Encounter for screening mammogram for malignant neoplasm of breast: Secondary | ICD-10-CM

## 2020-05-20 ENCOUNTER — Ambulatory Visit
Admission: RE | Admit: 2020-05-20 | Discharge: 2020-05-20 | Disposition: A | Discharge status: Home / Self Care | Payer: BC Managed Care – PPO | Source: Ambulatory Visit | Attending: Surgery | Admitting: Surgery

## 2020-05-20 DIAGNOSIS — E041 Nontoxic single thyroid nodule: Secondary | ICD-10-CM

## 2020-06-15 ENCOUNTER — Other Ambulatory Visit: Payer: Self-pay | Admitting: Surgery

## 2020-06-15 DIAGNOSIS — E041 Nontoxic single thyroid nodule: Secondary | ICD-10-CM

## 2020-08-05 ENCOUNTER — Other Ambulatory Visit (HOSPITAL_COMMUNITY)
Admission: RE | Admit: 2020-08-05 | Discharge: 2020-08-05 | Disposition: A | Discharge status: Home / Self Care | Payer: BC Managed Care – PPO | Source: Ambulatory Visit | Attending: Radiology | Admitting: Radiology

## 2020-08-05 ENCOUNTER — Ambulatory Visit
Admission: RE | Admit: 2020-08-05 | Discharge: 2020-08-05 | Disposition: A | Discharge status: Home / Self Care | Payer: BC Managed Care – PPO | Source: Ambulatory Visit | Attending: Surgery | Admitting: Surgery

## 2020-08-05 DIAGNOSIS — E041 Nontoxic single thyroid nodule: Secondary | ICD-10-CM | POA: Diagnosis present

## 2020-08-05 DIAGNOSIS — D440 Neoplasm of uncertain behavior of thyroid gland: Secondary | ICD-10-CM | POA: Diagnosis not present

## 2020-08-07 LAB — CYTOLOGY - NON PAP

## 2020-08-07 NOTE — Progress Notes (Signed)
FNA biopsy did not yield enough cells for pathology to review.  Can't tell if benign or malignant, but certainly has a benign history and I do not think malignancy is a concern. Hopefully they removed enough fluid that her symptoms have improved.  Claiborne Billings - please check with patient regarding symptoms and possible follow up in the office.  tmg  Armandina Gemma, MD [facility] Office: [phone]

## 2020-10-23 ENCOUNTER — Other Ambulatory Visit: Payer: Self-pay | Admitting: Otolaryngology

## 2020-10-23 ENCOUNTER — Other Ambulatory Visit (HOSPITAL_COMMUNITY): Payer: Self-pay | Admitting: Otolaryngology

## 2020-10-23 DIAGNOSIS — R1310 Dysphagia, unspecified: Secondary | ICD-10-CM

## 2020-11-04 ENCOUNTER — Ambulatory Visit (HOSPITAL_COMMUNITY)
Admission: RE | Admit: 2020-11-04 | Discharge: 2020-11-04 | Disposition: A | Discharge status: Home / Self Care | Payer: BC Managed Care – PPO | Source: Ambulatory Visit | Attending: Otolaryngology | Admitting: Otolaryngology

## 2020-11-04 DIAGNOSIS — R1310 Dysphagia, unspecified: Secondary | ICD-10-CM | POA: Insufficient documentation

## 2023-08-14 ENCOUNTER — Ambulatory Visit (INDEPENDENT_AMBULATORY_CARE_PROVIDER_SITE_OTHER)

## 2023-08-14 ENCOUNTER — Ambulatory Visit: Admitting: Podiatry

## 2023-08-14 ENCOUNTER — Encounter: Payer: Self-pay | Admitting: Podiatry

## 2023-08-14 VITALS — Ht 66.0 in | Wt 184.8 lb

## 2023-08-14 DIAGNOSIS — M65972 Unspecified synovitis and tenosynovitis, left ankle and foot: Secondary | ICD-10-CM | POA: Diagnosis not present

## 2023-08-14 DIAGNOSIS — M778 Other enthesopathies, not elsewhere classified: Secondary | ICD-10-CM | POA: Diagnosis not present

## 2023-08-14 MED ORDER — BETAMETHASONE SOD PHOS & ACET 6 (3-3) MG/ML IJ SUSP: 3.0000 mg | Freq: Once | INTRAMUSCULAR | Status: AC

## 2023-08-14 MED ORDER — METHYLPREDNISOLONE 4 MG PO TBPK: ORAL_TABLET | ORAL | 0 refills | Status: AC

## 2023-08-14 NOTE — Progress Notes (Signed)
   Chief Complaint  Patient presents with   Foot Pain    Pt is here due to left foot pain she states she noticed the pain Monday and by Saturday her foot was swollen, no injury to foot, states when she sits or stands for to long it feels like the circulation in the foot stops then get a numb tingling sensation, states she is leaving to go out the country Thursday and foot need to be better.    HPI: 53 y.o. female PMHx smoker 1ppd x 40 yrs presenting today for acute onset of left foot pain.  Idiopathic which began Monday, 08/07/2023.  Over the course of the week it slowly became worse.  She has not anything currently for treatment.  Past Medical History:  Diagnosis Date   Cellulitis 02/2019   FACIAL    Past Surgical History:  Procedure Laterality Date   TUMOR EXCISION      No Known Allergies   Physical Exam: General: The patient is alert and oriented x3 in no acute distress.  Dermatology: Skin is warm, dry and supple bilateral lower extremities.   Vascular: Slight delayed capillary refill however pulses are palpable bilateral.  No significant appreciable erythema or edema  Neurological: Grossly intact via light touch  Musculoskeletal Exam: No pedal deformities noted.  Significant tenderness with palpation noted to the sesamoid apparatus as well as the first MTP of the left foot.  No crepitus with range of motion but there is significant pain and tenderness  Radiographic Exam LT foot 08/14/2023:  Normal osseous mineralization. Joint spaces preserved.  No fractures or osseous irregularities noted.  Assessment/Plan of Care: 1.  First MTP capsulitis/sesamoiditis left  -Patient evaluated.  X-rays reviewed -Injection of 0.5 cc Celestone  Soluspan injected in the first MTP left foot -Postop shoe dispensed.  WBAT -Prescription for Medrol  Dosepak -RICE -Return to clinic as needed  *Leaving for a vacation to [place] on Thursday, 08/17/2023     Maureen Richardson, DPM [facility]  Dr. Thresa EMERSON Richardson, DPM    [address] [place], [place] [address]                Office [phone]  Fax [phone]
